# Patient Record
Sex: Female | Born: 1995 | Hispanic: No | Marital: Married | Smoking: Never smoker
Health system: Southern US, Community
[De-identification: ages and names within clinical notes are randomized; demographics above are authoritative.]

---

## 2020-03-01 ENCOUNTER — Emergency Department (HOSPITAL_COMMUNITY)
Admission: EM | Admit: 2020-03-01 | Discharge: 2020-03-01 | Disposition: A | Discharge status: Home / Self Care | Payer: Medicaid Other | Attending: Emergency Medicine | Admitting: Emergency Medicine

## 2020-03-01 ENCOUNTER — Other Ambulatory Visit: Payer: Self-pay

## 2020-03-01 ENCOUNTER — Encounter (HOSPITAL_COMMUNITY): Payer: Self-pay | Admitting: Emergency Medicine

## 2020-03-01 ENCOUNTER — Emergency Department (HOSPITAL_COMMUNITY): Payer: Medicaid Other

## 2020-03-01 DIAGNOSIS — W108XXA Fall (on) (from) other stairs and steps, initial encounter: Secondary | ICD-10-CM | POA: Diagnosis not present

## 2020-03-01 DIAGNOSIS — S82841A Displaced bimalleolar fracture of right lower leg, initial encounter for closed fracture: Secondary | ICD-10-CM | POA: Diagnosis not present

## 2020-03-01 DIAGNOSIS — S99911A Unspecified injury of right ankle, initial encounter: Secondary | ICD-10-CM | POA: Diagnosis present

## 2020-03-01 MED ORDER — OXYCODONE-ACETAMINOPHEN 5-325 MG PO TABS
1.0000 | ORAL_TABLET | Freq: Once | ORAL | Status: AC
  Administered 2020-03-01: 1 via ORAL
  Filled 2020-03-01: qty 1

## 2020-03-01 MED ORDER — IBUPROFEN 600 MG PO TABS: 600.0000 mg | ORAL_TABLET | Freq: Four times a day (QID) | ORAL | 0 refills | Status: DC

## 2020-03-01 MED ORDER — OXYCODONE-ACETAMINOPHEN 5-325 MG PO TABS
1.0000 | ORAL_TABLET | Freq: Four times a day (QID) | ORAL | 0 refills | Status: DC | PRN
Start: 2020-03-01 — End: 2020-03-07

## 2020-03-01 NOTE — ED Triage Notes (Signed)
Patient reports right ankle pain after fall today. Speaks Dari.

## 2020-03-01 NOTE — Progress Notes (Signed)
Orthopedic Tech Progress Note Patient Details:  Jill Sanford January 20, 1996 [id number]  Ortho Devices Type of Ortho Device: Ace wrap, Stirrup splint, Post (short leg) splint, Crutches Ortho Device/Splint Location: RLE splint Ortho Device/Splint Interventions: Ordered, Application, Adjustment   Post Interventions Patient Tolerated: Well Instructions Provided: Care of device   Jennye Moccasin 03/01/2020, 9:17 PM

## 2020-03-01 NOTE — ED Provider Notes (Signed)
[facility]-EMERGENCY DEPT Provider Note   CSN: [id number] Arrival date & time: 03/01/20  1922     History Chief Complaint  Patient presents with  . Ankle Pain    Jill Sanford is a 24 y.o. female presenting to the emergency department with sudden onset of right ankle injury that occurred this morning.  She was going up the stairs today when she inverted her ankle causing injury.  She has had significant pain to the right ankle with worsening swelling since that time.  She denies numbness to her toes, pain in her knee, or head injury.  Not on anticoagulation.  She is breast-feeding her 11 month old.   The history is provided by the patient.       History reviewed. No pertinent past medical history.  There are no problems to display for this patient.   History reviewed. No pertinent surgical history.   OB History   No obstetric history on file.     No family history on file.  Social History   Tobacco Use  . Smoking status: Not on file  Substance Use Topics  . Alcohol use: Not on file  . Drug use: Not on file    Home Medications Prior to Admission medications   Medication Sig Start Date End Date Taking? Authorizing Provider  ibuprofen (ADVIL) 600 MG tablet Take 1 tablet (600 mg total) by mouth every 6 (six) hours. 03/01/20   Brayla Pat, [place] N, PA-C  oxyCODONE-acetaminophen (PERCOCET/ROXICET) 5-325 MG tablet Take 1-2 tablets by mouth every 6 (six) hours as needed for severe pain. 03/01/20   Shuna Tabor, [place] N, PA-C    Allergies    Patient has no known allergies.  Review of Systems   Review of Systems  Musculoskeletal: Positive for arthralgias and joint swelling.  Skin: Negative for wound.  Neurological: Negative for numbness.  All other systems reviewed and are negative.   Physical Exam Updated Vital Signs BP 120/76   Pulse 84   Temp 99.8 F (37.7 C) (Oral)   Resp 16   LMP  (LMP Unknown)   SpO2 100%   Physical Exam Vitals and  nursing note reviewed.  Constitutional:      Appearance: She is well-developed.  HENT:     Head: Normocephalic and atraumatic.  Eyes:     Conjunctiva/sclera: Conjunctivae normal.  Cardiovascular:     Rate and Rhythm: Normal rate.  Pulmonary:     Effort: Pulmonary effort is normal.  Abdominal:     Palpations: Abdomen is soft.  Musculoskeletal:     Comments: Right ankle with significant swelling, bruising medially. Very tender throughout. No wounds. Normal sensation   Skin:    General: Skin is warm.  Neurological:     Mental Status: She is alert.  Psychiatric:        Behavior: Behavior normal.     ED Results / Procedures / Treatments   Labs (all labs ordered are listed, but only abnormal results are displayed) Labs Reviewed - No data to display  EKG None  Radiology DG Ankle Complete Right  Result Date: 03/01/2020 CLINICAL DATA:  Fall for EXAM: RIGHT ANKLE - COMPLETE 3+ VIEW COMPARISON:  None. FINDINGS: There is a comminuted displaced fracture seen through the medial malleolus and distal fibula. There is widening of the medial clear space measuring approximately 8 mm. There is significant surrounding soft tissue swelling. IMPRESSION: Comminuted displaced fractures through the medial malleolus and distal fibula with 8 mm of medial clear space widening.  There is overlying soft tissue swelling. Electronically Signed   By: Jonna Clark M.D.   On: 03/01/2020 20:05    Procedures Procedures (including critical care time)  Medications Ordered in ED Medications  oxyCODONE-acetaminophen (PERCOCET/ROXICET) 5-325 MG per tablet 1 tablet (1 tablet Oral Given 03/01/20 2058)    ED Course  I have reviewed the triage vital signs and the nursing notes.  Pertinent labs & imaging results that were available during my care of the patient were reviewed by me and considered in my medical decision making (see chart for details).    MDM Rules/Calculators/A&P                          Patient  presenting with bimalleolar fracture after tripping up the stairs today.  Neurovascular intact.  No wounds.  Not on anticoagulation.  Consulted with Dr. Ophelia Charter with orthopedics.  Agrees with plan for posterior/stirrup splint, and close outpatient follow-up for surgical management.  Ankle was reduced with gentle pressure to more normal anatomical alignment while splint was placed. NV intact following splint placement by ortho tech. Pain treated, will d/c with rx for pain.  Elevation, ice, ibuprofen.  She is breast-feeding a 38-month-old, discussed risks of narcotic pain medication.  Patient verbalized understanding.  Patient discharged with referral to Dr. Ophelia Charter, instructions to call for appointment on Tuesday per his recommendation.  Return precautions discussed.  Patient is agreeable to plan, appropriate for discharge.   Final Clinical Impression(s) / ED Diagnoses Final diagnoses:  Closed bimalleolar fracture of right ankle, initial encounter    Rx / DC Orders ED Discharge Orders         Ordered    oxyCODONE-acetaminophen (PERCOCET/ROXICET) 5-325 MG tablet  Every 6 hours PRN        03/01/20 2133    ibuprofen (ADVIL) 600 MG tablet  Every 6 hours        03/01/20 2133           Telly Jawad, [place] N, PA-C 03/01/20 2329    Benjiman Core, MD 03/02/20 1452

## 2020-03-01 NOTE — Discharge Instructions (Addendum)
Please read instructions below. Keep the splint clean, dry and in place at all times. Elevate your leg as much as possible. Do not bear any weight on your right leg. You can take oxycodone every 6 hours as needed for severe pain.  Take 600mg  ibuprofen every 6 hours to help manage pain and swelling. Call the orthopedic specialist office the next business day to schedule an appointment for further management and to discuss surgery. He should be able to see you on Tuesday.  Return to the ED for severely worsening pain, numbness in your foot, if your foot turns blue.

## 2020-03-05 ENCOUNTER — Ambulatory Visit (INDEPENDENT_AMBULATORY_CARE_PROVIDER_SITE_OTHER): Payer: Self-pay | Admitting: Orthopaedic Surgery

## 2020-03-05 ENCOUNTER — Other Ambulatory Visit: Payer: Self-pay

## 2020-03-05 ENCOUNTER — Encounter: Payer: Self-pay | Admitting: Orthopaedic Surgery

## 2020-03-05 ENCOUNTER — Other Ambulatory Visit (HOSPITAL_COMMUNITY)
Admission: RE | Admit: 2020-03-05 | Discharge: 2020-03-05 | Disposition: A | Discharge status: Home / Self Care | Payer: HRSA Program | Source: Ambulatory Visit | Attending: Orthopaedic Surgery | Admitting: Orthopaedic Surgery

## 2020-03-05 DIAGNOSIS — Z20822 Contact with and (suspected) exposure to covid-19: Secondary | ICD-10-CM | POA: Insufficient documentation

## 2020-03-05 DIAGNOSIS — Z01812 Encounter for preprocedural laboratory examination: Secondary | ICD-10-CM | POA: Insufficient documentation

## 2020-03-05 DIAGNOSIS — S82841A Displaced bimalleolar fracture of right lower leg, initial encounter for closed fracture: Secondary | ICD-10-CM

## 2020-03-05 LAB — SARS CORONAVIRUS 2 (TAT 6-24 HRS): SARS Coronavirus 2: NEGATIVE

## 2020-03-05 NOTE — Progress Notes (Signed)
Unable to reach pt by phone. Via [employer] (Dari language) Siad, I left pre-op instructions on pt's voicemail.  Covid test done today.

## 2020-03-05 NOTE — Progress Notes (Signed)
Office Visit Note   Patient: Jill Sanford           Date of Birth: 1995/08/01           MRN: [id number] Visit Date: 03/05/2020              Requested by: No referring provider defined for this encounter. PCP: Patient, No Pcp Per   Assessment & Plan: Visit Diagnoses:  1. Bimalleolar ankle fracture, right, closed, initial encounter     Plan: Patient has ankle fracture dislocation with subluxation and unstable bimalleolar ankle fracture with medial and lateral malleolus fractures.  She will require open reduction internal fixation of her right bimalleolar ankle fracture.  We discussed general anesthesia possible preoperative popliteal block to help with postop pain.  If she has successful block she could be treated as an outpatient be discharged home after the procedure.  If a block is not successful she can stay overnight.  Plan procedure discussed with patient, husband through interpreter as well as her local sponsor.  We discussed medial screw fixation of the medial malleolus which is displaced as well as lateral plate screw for  Follow-Up Instructions: No follow-ups on file.   Orders:  No orders of the defined types were placed in this encounter.  No orders of the defined types were placed in this encounter.     Procedures: No procedures performed   Clinical Data: No additional findings.   Subjective: Chief Complaint  Patient presents with  . Right Ankle - Fracture    DOI 03/01/2020    HPI 24 year old female here with her husband as well was husband's nephew interpreter and also local [employer] sponsor is here concerning a right ankle injury that occurred when she is going up steps rolled her ankle suffering a bimalleolar ankle fracture.  Patient is breast-feeding her 62-month-old child is a been unable to walk is been in a sugar tong splint.  Review of Systems no history of ankle injury patient's been active healthy without any surgeries or serious illnesses or visits  to the hospital other than childbirth 2 months ago which was in [place].   Objective: Vital Signs: LMP  (LMP Unknown)   Physical Exam Constitutional:      Appearance: She is well-developed.  HENT:     Head: Normocephalic.     Right Ear: External ear normal.     Left Ear: External ear normal.  Eyes:     Pupils: Pupils are equal, round, and reactive to light.  Neck:     Thyroid: No thyromegaly.     Trachea: No tracheal deviation.  Cardiovascular:     Rate and Rhythm: Normal rate.  Pulmonary:     Effort: Pulmonary effort is normal.  Abdominal:     Palpations: Abdomen is soft.  Skin:    General: Skin is warm and dry.  Neurological:     Mental Status: She is alert and oriented to person, place, and time.  Psychiatric:        Behavior: Behavior normal.     Ortho Exam patient is in a well-padded short leg splint.  Normal color to the toes.  Specialty Comments:  No specialty comments available.  Imaging: Narrative & Impression  CLINICAL DATA:  Fall for  EXAM: RIGHT ANKLE - COMPLETE 3+ VIEW  COMPARISON:  None.  FINDINGS: There is a comminuted displaced fracture seen through the medial malleolus and distal fibula. There is widening of the medial clear space measuring approximately 8 mm. There  is significant surrounding soft tissue swelling.  IMPRESSION: Comminuted displaced fractures through the medial malleolus and distal fibula with 8 mm of medial clear space widening. There is overlying soft tissue swelling.   Electronically Signed   By: Jonna Clark M.D.   On: 03/01/2020 20:05       PMFS History: Patient Active Problem List   Diagnosis Date Noted  . Bimalleolar ankle fracture, right, closed, initial encounter 03/05/2020   No past medical history on file.  No family history on file.  No past surgical history on file. Social History   Occupational History  . Not on file  Tobacco Use  . Smoking status: Never Smoker  . Smokeless  tobacco: Never Used  Substance and Sexual Activity  . Alcohol use: Not Currently  . Drug use: Not on file  . Sexual activity: Not on file

## 2020-03-05 NOTE — H&P (Signed)
Jill Sanford is an 24 y.o. female.   Chief Complaint: Right ankle fracture HPI: Patient with right bimalleolar ankle fracture is being seen by me for preop evaluation.  Patient comes in with husband and interpreter.  Patient was seen in the ED her fifth 2021 after she was going up some stairs and suffered an inversion injury to her ankle.  Patient was put in a splint.  X-rays showed:  IMPRESSION: Comminuted displaced fractures through the medial malleolus and distal fibula with 8 mm of medial clear space widening. There is overlying soft tissue swelling.  No past medical history on file.  No past surgical history on file.  No family history on file. Social History:  reports that she has never smoked. She has never used smokeless tobacco. She reports previous alcohol use. No history on file for drug use.  Allergies: No Known Allergies  No medications prior to admission.    No results found for this or any previous visit (from the past 48 hour(s)). No results found.  Review of Systems  Constitutional: Positive for activity change.  HENT: Negative.   Respiratory: Negative.   Cardiovascular: Negative.   Gastrointestinal: Negative.     There were no vitals taken for this visit. Physical Exam HENT:     Head: Normocephalic and atraumatic.  Eyes:     Extraocular Movements: Extraocular movements intact.     Pupils: Pupils are equal, round, and reactive to light.  Cardiovascular:     Heart sounds: Normal heart sounds.  Pulmonary:     Effort: Pulmonary effort is normal. No respiratory distress.  Skin:    Comments: Splint intact right lower extremity  Neurological:     Mental Status: She is alert and oriented to person, place, and time.  Psychiatric:        Mood and Affect: Mood normal.      Assessment/Plan Right bimalleolar ankle fracture  We will proceed with ORIF right bimalleolar ankle fracture tomorrow.  Surgical procedure discussed earlier with Dr. Ophelia Charter.  Patient  and husband who was present had no further questions for me.  Zonia Kief, PA-C 03/05/2020, 3:33 PM

## 2020-03-06 ENCOUNTER — Ambulatory Visit (HOSPITAL_COMMUNITY): Payer: Self-pay | Admitting: Certified Registered Nurse Anesthetist

## 2020-03-06 ENCOUNTER — Encounter (HOSPITAL_COMMUNITY)
Admission: RE | Disposition: A | Discharge status: Home / Self Care | Payer: Self-pay | Source: Home / Self Care | Attending: Orthopaedic Surgery

## 2020-03-06 ENCOUNTER — Encounter (HOSPITAL_COMMUNITY): Payer: Self-pay | Admitting: Orthopaedic Surgery

## 2020-03-06 ENCOUNTER — Observation Stay (HOSPITAL_COMMUNITY)
Admission: RE | Admit: 2020-03-06 | Discharge: 2020-03-07 | Disposition: A | Discharge status: Home / Self Care | Payer: Self-pay | Attending: Orthopaedic Surgery | Admitting: Orthopaedic Surgery

## 2020-03-06 ENCOUNTER — Other Ambulatory Visit: Payer: Self-pay

## 2020-03-06 ENCOUNTER — Ambulatory Visit (HOSPITAL_COMMUNITY): Payer: Self-pay

## 2020-03-06 DIAGNOSIS — X500XXA Overexertion from strenuous movement or load, initial encounter: Secondary | ICD-10-CM | POA: Insufficient documentation

## 2020-03-06 DIAGNOSIS — S93431A Sprain of tibiofibular ligament of right ankle, initial encounter: Secondary | ICD-10-CM | POA: Insufficient documentation

## 2020-03-06 DIAGNOSIS — Z419 Encounter for procedure for purposes other than remedying health state, unspecified: Secondary | ICD-10-CM

## 2020-03-06 DIAGNOSIS — S82851A Displaced trimalleolar fracture of right lower leg, initial encounter for closed fracture: Principal | ICD-10-CM | POA: Insufficient documentation

## 2020-03-06 DIAGNOSIS — S82891A Other fracture of right lower leg, initial encounter for closed fracture: Secondary | ICD-10-CM | POA: Diagnosis present

## 2020-03-06 DIAGNOSIS — S93439A Sprain of tibiofibular ligament of unspecified ankle, initial encounter: Secondary | ICD-10-CM

## 2020-03-06 HISTORY — PX: ORIF ANKLE FRACTURE: SHX5408

## 2020-03-06 LAB — CBC
HCT: 36.6 % (ref 36.0–46.0)
Hemoglobin: 11.9 g/dL — ABNORMAL LOW (ref 12.0–15.0)
MCH: 29.3 pg (ref 26.0–34.0)
MCHC: 32.5 g/dL (ref 30.0–36.0)
MCV: 90.1 fL (ref 80.0–100.0)
Platelets: 271 10*3/uL (ref 150–400)
RBC: 4.06 MIL/uL (ref 3.87–5.11)
RDW: 11 % — ABNORMAL LOW (ref 11.5–15.5)
WBC: 7.2 10*3/uL (ref 4.0–10.5)
nRBC: 0 % (ref 0.0–0.2)

## 2020-03-06 LAB — BASIC METABOLIC PANEL
Anion gap: 12 (ref 5–15)
BUN: 13 mg/dL (ref 6–20)
CO2: 22 mmol/L (ref 22–32)
Calcium: 9.3 mg/dL (ref 8.9–10.3)
Chloride: 105 mmol/L (ref 98–111)
Creatinine, Ser: 0.52 mg/dL (ref 0.44–1.00)
GFR, Estimated: >60 mL/min (ref >60)
Glucose, Bld: 88 mg/dL (ref 70–99)
Potassium: 4.6 mmol/L (ref 3.5–5.1)
Sodium: 139 mmol/L (ref 135–145)

## 2020-03-06 LAB — SURGICAL PCR SCREEN
MRSA, PCR: NEGATIVE
Staphylococcus aureus: NEGATIVE

## 2020-03-06 LAB — POCT PREGNANCY, URINE: Preg Test, Ur: NEGATIVE

## 2020-03-06 SURGERY — OPEN REDUCTION INTERNAL FIXATION (ORIF) ANKLE FRACTURE
Anesthesia: General | Site: Ankle | Laterality: Right

## 2020-03-06 MED ORDER — EPHEDRINE SULFATE-NACL 50-0.9 MG/10ML-% IV SOSY
PREFILLED_SYRINGE | INTRAVENOUS | Status: DC | PRN
  Administered 2020-03-06 (×4): 5 mg via INTRAVENOUS

## 2020-03-06 MED ORDER — DEXAMETHASONE SODIUM PHOSPHATE 10 MG/ML IJ SOLN
INTRAMUSCULAR | Status: DC | PRN
  Administered 2020-03-06: 10 mg via INTRAVENOUS

## 2020-03-06 MED ORDER — SODIUM CHLORIDE 0.9 % IV SOLN: INTRAVENOUS | Status: DC

## 2020-03-06 MED ORDER — ACETAMINOPHEN 500 MG PO TABS
ORAL_TABLET | ORAL | Status: AC
  Administered 2020-03-06: 1000 mg via ORAL
  Filled 2020-03-06: qty 2

## 2020-03-06 MED ORDER — CEFAZOLIN SODIUM-DEXTROSE 2-4 GM/100ML-% IV SOLN
INTRAVENOUS | Status: AC
  Filled 2020-03-06: qty 100

## 2020-03-06 MED ORDER — OXYCODONE HCL 5 MG/5ML PO SOLN: 5.0000 mg | Freq: Once | ORAL | Status: DC | PRN

## 2020-03-06 MED ORDER — CEFAZOLIN SODIUM-DEXTROSE 2-4 GM/100ML-% IV SOLN
2.0000 g | INTRAVENOUS | Status: AC
  Administered 2020-03-06: 2 g via INTRAVENOUS

## 2020-03-06 MED ORDER — DOCUSATE SODIUM 100 MG PO CAPS
100.0000 mg | ORAL_CAPSULE | Freq: Two times a day (BID) | ORAL | Status: DC
  Administered 2020-03-06 – 2020-03-07 (×2): 100 mg via ORAL
  Filled 2020-03-06 (×2): qty 1

## 2020-03-06 MED ORDER — ACETAMINOPHEN 500 MG PO TABS: 1000.0000 mg | ORAL_TABLET | Freq: Once | ORAL | Status: AC

## 2020-03-06 MED ORDER — EPHEDRINE 5 MG/ML INJ
INTRAVENOUS | Status: AC
  Filled 2020-03-06: qty 10

## 2020-03-06 MED ORDER — PHENYLEPHRINE HCL-NACL 10-0.9 MG/250ML-% IV SOLN
INTRAVENOUS | Status: DC | PRN
  Administered 2020-03-06: 20 ug/min via INTRAVENOUS

## 2020-03-06 MED ORDER — MIDAZOLAM HCL 2 MG/2ML IJ SOLN: 0.5000 mg | Freq: Once | INTRAMUSCULAR | Status: DC | PRN

## 2020-03-06 MED ORDER — ORAL CARE MOUTH RINSE: 15.0000 mL | Freq: Once | OROMUCOSAL | Status: AC

## 2020-03-06 MED ORDER — MIDAZOLAM HCL 2 MG/2ML IJ SOLN: 1.0000 mg | Freq: Once | INTRAMUSCULAR | Status: AC

## 2020-03-06 MED ORDER — ONDANSETRON HCL 4 MG/2ML IJ SOLN
INTRAMUSCULAR | Status: DC | PRN
  Administered 2020-03-06: 4 mg via INTRAVENOUS

## 2020-03-06 MED ORDER — PHENYLEPHRINE 40 MCG/ML (10ML) SYRINGE FOR IV PUSH (FOR BLOOD PRESSURE SUPPORT)
PREFILLED_SYRINGE | INTRAVENOUS | Status: DC | PRN
  Administered 2020-03-06: 80 ug via INTRAVENOUS
  Administered 2020-03-06: 40 ug via INTRAVENOUS
  Administered 2020-03-06: 80 ug via INTRAVENOUS
  Administered 2020-03-06: 120 ug via INTRAVENOUS
  Administered 2020-03-06 (×3): 80 ug via INTRAVENOUS

## 2020-03-06 MED ORDER — ONDANSETRON HCL 4 MG/2ML IJ SOLN: 4.0000 mg | Freq: Four times a day (QID) | INTRAMUSCULAR | Status: DC | PRN

## 2020-03-06 MED ORDER — MEPERIDINE HCL 25 MG/ML IJ SOLN: 6.2500 mg | INTRAMUSCULAR | Status: DC | PRN

## 2020-03-06 MED ORDER — PROMETHAZINE HCL 25 MG/ML IJ SOLN: 6.2500 mg | INTRAMUSCULAR | Status: DC | PRN

## 2020-03-06 MED ORDER — MIDAZOLAM HCL 2 MG/2ML IJ SOLN
INTRAMUSCULAR | Status: AC
  Administered 2020-03-06: 1 mg via INTRAVENOUS
  Filled 2020-03-06: qty 2

## 2020-03-06 MED ORDER — OXYCODONE HCL 5 MG PO TABS: 5.0000 mg | ORAL_TABLET | Freq: Once | ORAL | Status: DC | PRN

## 2020-03-06 MED ORDER — METOCLOPRAMIDE HCL 5 MG/ML IJ SOLN: 5.0000 mg | Freq: Three times a day (TID) | INTRAMUSCULAR | Status: DC | PRN

## 2020-03-06 MED ORDER — SCOPOLAMINE 1 MG/3DAYS TD PT72
MEDICATED_PATCH | TRANSDERMAL | Status: AC
  Administered 2020-03-06: 1.5 mg via TRANSDERMAL
  Filled 2020-03-06: qty 1

## 2020-03-06 MED ORDER — ONDANSETRON HCL 4 MG PO TABS: 4.0000 mg | ORAL_TABLET | Freq: Four times a day (QID) | ORAL | Status: DC | PRN

## 2020-03-06 MED ORDER — FENTANYL CITRATE (PF) 250 MCG/5ML IJ SOLN
INTRAMUSCULAR | Status: DC | PRN
  Administered 2020-03-06 (×2): 25 ug via INTRAVENOUS

## 2020-03-06 MED ORDER — BUPIVACAINE-EPINEPHRINE (PF) 0.5% -1:200000 IJ SOLN
INTRAMUSCULAR | Status: DC | PRN
  Administered 2020-03-06: 15 mL via PERINEURAL
  Administered 2020-03-06: 20 mL via PERINEURAL

## 2020-03-06 MED ORDER — LACTATED RINGERS IV SOLN: INTRAVENOUS | Status: DC

## 2020-03-06 MED ORDER — FENTANYL CITRATE (PF) 250 MCG/5ML IJ SOLN
INTRAMUSCULAR | Status: AC
  Filled 2020-03-06: qty 5

## 2020-03-06 MED ORDER — 0.9 % SODIUM CHLORIDE (POUR BTL) OPTIME
TOPICAL | Status: DC | PRN
  Administered 2020-03-06: 1000 mL

## 2020-03-06 MED ORDER — FENTANYL CITRATE (PF) 100 MCG/2ML IJ SOLN
INTRAMUSCULAR | Status: AC
  Administered 2020-03-06: 50 ug via INTRAVENOUS
  Filled 2020-03-06: qty 2

## 2020-03-06 MED ORDER — FENTANYL CITRATE (PF) 100 MCG/2ML IJ SOLN: 50.0000 ug | Freq: Once | INTRAMUSCULAR | Status: AC

## 2020-03-06 MED ORDER — OXYCODONE HCL 5 MG PO TABS
5.0000 mg | ORAL_TABLET | ORAL | Status: DC | PRN
  Administered 2020-03-07: 5 mg via ORAL
  Filled 2020-03-06 (×2): qty 1

## 2020-03-06 MED ORDER — METOCLOPRAMIDE HCL 5 MG PO TABS: 5.0000 mg | ORAL_TABLET | Freq: Three times a day (TID) | ORAL | Status: DC | PRN

## 2020-03-06 MED ORDER — PHENYLEPHRINE 40 MCG/ML (10ML) SYRINGE FOR IV PUSH (FOR BLOOD PRESSURE SUPPORT)
PREFILLED_SYRINGE | INTRAVENOUS | Status: AC
  Filled 2020-03-06: qty 10

## 2020-03-06 MED ORDER — BUPIVACAINE HCL (PF) 0.25 % IJ SOLN
INTRAMUSCULAR | Status: AC
  Filled 2020-03-06: qty 30

## 2020-03-06 MED ORDER — SCOPOLAMINE 1 MG/3DAYS TD PT72: 1.0000 | MEDICATED_PATCH | TRANSDERMAL | Status: DC

## 2020-03-06 MED ORDER — CHLORHEXIDINE GLUCONATE 0.12 % MT SOLN
OROMUCOSAL | Status: AC
  Administered 2020-03-06: 15 mL via OROMUCOSAL
  Filled 2020-03-06: qty 15

## 2020-03-06 MED ORDER — CHLORHEXIDINE GLUCONATE 0.12 % MT SOLN: 15.0000 mL | Freq: Once | OROMUCOSAL | Status: AC

## 2020-03-06 MED ORDER — BUPIVACAINE HCL (PF) 0.25 % IJ SOLN: INTRAMUSCULAR | Status: DC | PRN

## 2020-03-06 MED ORDER — HYDROMORPHONE HCL 1 MG/ML IJ SOLN
INTRAMUSCULAR | Status: AC
  Filled 2020-03-06: qty 1

## 2020-03-06 MED ORDER — LIDOCAINE 2% (20 MG/ML) 5 ML SYRINGE
INTRAMUSCULAR | Status: DC | PRN
  Administered 2020-03-06: 20 mg via INTRAVENOUS

## 2020-03-06 MED ORDER — HYDROMORPHONE HCL 1 MG/ML IJ SOLN
0.2500 mg | INTRAMUSCULAR | Status: DC | PRN
  Administered 2020-03-06: 0.5 mg via INTRAVENOUS

## 2020-03-06 MED ORDER — ASPIRIN 325 MG PO TABS
325.0000 mg | ORAL_TABLET | Freq: Every day | ORAL | Status: DC
  Administered 2020-03-07: 325 mg via ORAL
  Filled 2020-03-06: qty 1

## 2020-03-06 MED ORDER — PROPOFOL 10 MG/ML IV BOLUS
INTRAVENOUS | Status: AC
  Filled 2020-03-06: qty 20

## 2020-03-06 MED ORDER — PROPOFOL 10 MG/ML IV BOLUS
INTRAVENOUS | Status: DC | PRN
  Administered 2020-03-06: 50 mg via INTRAVENOUS
  Administered 2020-03-06: 200 mg via INTRAVENOUS
  Administered 2020-03-06 (×2): 50 mg via INTRAVENOUS

## 2020-03-06 MED ORDER — MIDAZOLAM HCL 2 MG/2ML IJ SOLN
INTRAMUSCULAR | Status: AC
  Filled 2020-03-06: qty 2

## 2020-03-06 MED ORDER — ACETAMINOPHEN 500 MG PO TABS
500.0000 mg | ORAL_TABLET | Freq: Once | ORAL | Status: DC
Start: 2020-03-06 — End: 2020-03-06

## 2020-03-06 SURGICAL SUPPLY — 64 items
BANDAGE ESMARK 6X9 LF (GAUZE/BANDAGES/DRESSINGS) IMPLANT
BIT DRILL 2.5X2.75 QC CALB (BIT) ×3 IMPLANT
BIT DRILL 2.9 CANN QC NONSTRL (BIT) ×3 IMPLANT
BNDG ELASTIC 4X5.8 VLCR STR LF (GAUZE/BANDAGES/DRESSINGS) IMPLANT
BNDG ELASTIC 6X5.8 VLCR STR LF (GAUZE/BANDAGES/DRESSINGS) ×3 IMPLANT
BNDG ESMARK 6X9 LF (GAUZE/BANDAGES/DRESSINGS)
COVER MAYO STAND STRL (DRAPES) ×3 IMPLANT
COVER SURGICAL LIGHT HANDLE (MISCELLANEOUS) ×3 IMPLANT
COVER WAND RF STERILE (DRAPES) IMPLANT
CUFF TOURN SGL QUICK 34 (TOURNIQUET CUFF) ×2
CUFF TOURN SGL QUICK 42 (TOURNIQUET CUFF) IMPLANT
CUFF TRNQT CYL 34X4.125X (TOURNIQUET CUFF) ×1 IMPLANT
DRAPE C-ARM 42X72 X-RAY (DRAPES) ×3 IMPLANT
DRAPE HALF SHEET 40X57 (DRAPES) ×3 IMPLANT
DRAPE INCISE IOBAN 66X45 STRL (DRAPES) ×3 IMPLANT
DRAPE U-SHAPE 47X51 STRL (DRAPES) ×3 IMPLANT
DRSG PAD ABDOMINAL 8X10 ST (GAUZE/BANDAGES/DRESSINGS) ×6 IMPLANT
DURAPREP 26ML APPLICATOR (WOUND CARE) ×3 IMPLANT
ELECT PENCIL ROCKER SW 15FT (MISCELLANEOUS) ×3 IMPLANT
ELECT REM PT RETURN 9FT ADLT (ELECTROSURGICAL) ×3
ELECTRODE REM PT RTRN 9FT ADLT (ELECTROSURGICAL) ×1 IMPLANT
GAUZE SPONGE 4X4 12PLY STRL (GAUZE/BANDAGES/DRESSINGS) IMPLANT
GAUZE XEROFORM 5X9 LF (GAUZE/BANDAGES/DRESSINGS) ×3 IMPLANT
GLOVE BIOGEL PI IND STRL 8 (GLOVE) ×1 IMPLANT
GLOVE BIOGEL PI INDICATOR 8 (GLOVE) ×2
GLOVE ORTHO TXT STRL SZ7.5 (GLOVE) ×3 IMPLANT
GOWN STRL REUS W/ TWL LRG LVL3 (GOWN DISPOSABLE) ×1 IMPLANT
GOWN STRL REUS W/ TWL XL LVL3 (GOWN DISPOSABLE) ×1 IMPLANT
GOWN STRL REUS W/TWL 2XL LVL3 (GOWN DISPOSABLE) IMPLANT
GOWN STRL REUS W/TWL LRG LVL3 (GOWN DISPOSABLE) ×2
GOWN STRL REUS W/TWL XL LVL3 (GOWN DISPOSABLE) ×2
K-WIRE ACE 1.6X6 (WIRE) ×6
KIT BASIN OR (CUSTOM PROCEDURE TRAY) ×3 IMPLANT
KIT TURNOVER KIT B (KITS) ×3 IMPLANT
KWIRE ACE 1.6X6 (WIRE) ×2 IMPLANT
MANIFOLD NEPTUNE II (INSTRUMENTS) ×3 IMPLANT
NS IRRIG 1000ML POUR BTL (IV SOLUTION) ×3 IMPLANT
PACK ORTHO EXTREMITY (CUSTOM PROCEDURE TRAY) ×3 IMPLANT
PAD ARMBOARD 7.5X6 YLW CONV (MISCELLANEOUS) ×6 IMPLANT
PAD CAST 4YDX4 CTTN HI CHSV (CAST SUPPLIES) IMPLANT
PADDING CAST COTTON 4X4 STRL (CAST SUPPLIES)
PADDING CAST COTTON 6X4 STRL (CAST SUPPLIES) ×6 IMPLANT
PLATE LOCK 7H 92 BILAT FIB (Plate) ×3 IMPLANT
SCREW ACE CAN 4.0 40M (Screw) ×6 IMPLANT
SCREW LOCK CORT STAR 3.5X10 (Screw) ×6 IMPLANT
SCREW LOCK CORT STAR 3.5X12 (Screw) ×3 IMPLANT
SCREW LOW PROFILE 12MMX3.5MM (Screw) ×9 IMPLANT
SCREW LOW PROFILE 22MMX3.5MM (Screw) ×3 IMPLANT
SCREW LP 3.5 (Screw) ×3 IMPLANT
SPLINT FIBERGLASS 3X35 (CAST SUPPLIES) ×6 IMPLANT
SPONGE LAP 18X18 RF (DISPOSABLE) ×3 IMPLANT
STAPLER VISISTAT 35W (STAPLE) ×3 IMPLANT
SUCTION FRAZIER HANDLE 10FR (MISCELLANEOUS) ×2
SUCTION TUBE FRAZIER 10FR DISP (MISCELLANEOUS) ×1 IMPLANT
SUT ETHILON 3 0 PS 1 (SUTURE) ×6 IMPLANT
SUT VIC AB 2-0 CT1 27 (SUTURE) ×4
SUT VIC AB 2-0 CT1 TAPERPNT 27 (SUTURE) ×2 IMPLANT
SYR CONTROL 10ML LL (SYRINGE) ×3 IMPLANT
TOWEL GREEN STERILE (TOWEL DISPOSABLE) ×3 IMPLANT
TOWEL GREEN STERILE FF (TOWEL DISPOSABLE) ×3 IMPLANT
TUBE CONNECTING 12'X1/4 (SUCTIONS) ×1
TUBE CONNECTING 12X1/4 (SUCTIONS) ×2 IMPLANT
WATER STERILE IRR 1000ML POUR (IV SOLUTION) ×3 IMPLANT
YANKAUER SUCT BULB TIP NO VENT (SUCTIONS) ×3 IMPLANT

## 2020-03-06 NOTE — Anesthesia Preprocedure Evaluation (Addendum)
Anesthesia Evaluation  Patient identified by MRN, date of birth, ID band Patient awake    Reviewed: Allergy & Precautions, NPO status , Patient's Chart, lab work & pertinent test results  History of Anesthesia Complications Negative for: history of anesthetic complications  Airway Mallampati: II  TM Distance: >3 FB Neck ROM: Full    Dental  (+) Dental Advisory Given   Pulmonary neg pulmonary ROS,  03/05/2020 SARS coronavirus NEG   breath sounds clear to auscultation       Cardiovascular negative cardio ROS   Rhythm:Regular Rate:Normal     Neuro/Psych negative neurological ROS     GI/Hepatic negative GI ROS, Neg liver ROS,   Endo/Other  negative endocrine ROS  Renal/GU negative Renal ROS     Musculoskeletal   Abdominal   Peds  Hematology negative hematology ROS (+)   Anesthesia Other Findings   Reproductive/Obstetrics (+) Breast feeding                             Anesthesia Physical Anesthesia Plan  ASA: I  Anesthesia Plan: General   Post-op Pain Management: GA combined w/ Regional for post-op pain   Induction: Intravenous  PONV Risk Score and Plan: 3 and Ondansetron, Dexamethasone and Scopolamine patch - Pre-op  Airway Management Planned: LMA  Additional Equipment: None  Intra-op Plan:   Post-operative Plan:   Informed Consent: I have reviewed the patients History and Physical, chart, labs and discussed the procedure including the risks, benefits and alternatives for the proposed anesthesia with the patient or authorized representative who has indicated his/her understanding and acceptance.     Dental advisory given  Plan Discussed with: CRNA and Surgeon  Anesthesia Plan Comments: (Plan routine monitors, GA with adductor canal and popliteal blocks for post op analgesia)       Anesthesia Quick Evaluation

## 2020-03-06 NOTE — Progress Notes (Addendum)
Patient gave birth four days ago.  Spoke with OB rapid response RN Theone Murdoch.  Gave MRN and room assignment [phone] for appropriate follow up.   Upon further investigation by [facility] RN Toni Amend, baby was born two months ago.  No OB follow up needed at this point.

## 2020-03-06 NOTE — Transfer of Care (Signed)
Immediate Anesthesia Transfer of Care Note  Patient: Marketing executive  Procedure(s) Performed: OPEN REDUCTION INTERNAL FIXATION RIGHT BIMALLEOLAR ANKLE FRACTURE (Right Ankle)  Patient Location: PACU  Anesthesia Type:General  Level of Consciousness: drowsy  Airway & Oxygen Therapy: Patient Spontanous Breathing and Patient connected to face mask  Post-op Assessment: Report given to RN and Post -op Vital signs reviewed and stable  Post vital signs: Reviewed and stable  Last Vitals:  Vitals Value Taken Time  BP 116/47 03/06/20 1856  Temp    Pulse 108 03/06/20 1858  Resp 26 03/06/20 1858  SpO2 99 % 03/06/20 1858  Vitals shown include unvalidated device data.  Last Pain:  Vitals:   03/06/20 1359  TempSrc:   PainSc: 0-No pain         Complications: No complications documented.

## 2020-03-06 NOTE — Anesthesia Procedure Notes (Signed)
Procedure Name: LMA Insertion Date/Time: 03/06/2020 5:14 PM Performed by: Lelon Perla, CRNA Pre-anesthesia Checklist: Patient identified, Emergency Drugs available, Suction available and Patient being monitored Patient Re-evaluated:Patient Re-evaluated prior to induction Oxygen Delivery Method: Circle System Utilized Preoxygenation: Pre-oxygenation with 100% oxygen Induction Type: IV induction Ventilation: Mask ventilation without difficulty LMA: LMA inserted LMA Size: 4.0 Number of attempts: 1 Airway Equipment and Method: Bite block Placement Confirmation: positive ETCO2 Tube secured with: Tape Dental Injury: Teeth and Oropharynx as per pre-operative assessment

## 2020-03-06 NOTE — Interval H&P Note (Signed)
History and Physical Interval Note:  03/06/2020 4:26 PM  Abby [place]  has presented today for surgery, with the diagnosis of right bimalleolar ankle fracture.  The various methods of treatment have been discussed with the patient and family. After consideration of risks, benefits and other options for treatment, the patient has consented to  Procedure(s): OPEN REDUCTION INTERNAL FIXATION RIGHT BIMALLEOLAR ANKLE FRACTURE (Right) as a surgical intervention.  The patient's history has been reviewed, patient examined, no change in status, stable for surgery.  I have reviewed the patient's chart and labs.  Questions were answered to the patient's satisfaction.     Eldred Manges

## 2020-03-06 NOTE — Progress Notes (Signed)
Patient arrival to 5N3 via hospital bed. Interpreter and family at bedside.Patient in bed resting ,bed in lowest position,call bell within reach.

## 2020-03-06 NOTE — Op Note (Signed)
Preop diagnosis: Right bimalleolar closed ankle fracture  Postop diagnosis:  Right closed trimalleolar ankle fracture, syndosmotic rupture    Procedure: Open reduction internal fixation bimalleolar fixation medial lateral malleolus of right closed trimalleolar ankle fracture.  Open reduction internal fixation of syndesmosis.  Surgeon Annell Greening, MD  Tourniquet 47 minutes  Anesthesia preoperative block popliteal plus LMA general anesthesia.  Complications: None  Implants Biomet composite lateral titanium plate.  Cannulated medial screws x2 medial malleolus 40 mm partially-threaded.  Procedure: After standard prepping draping was noted patient had blisters medially.  Initially x-ray showed bimalleolar ankle fracture.  After timeout procedure leg was elevated tourniquet inflated.  C-shaped curved medial incision was made saphenous vein was mobilized fracture hematoma was evacuated fracture was able to be reduced.  Sterile sponge was placed in lateral incision was made over the fibula subperiosteal dissection of the fibula evacuation hematoma reduction held with subtenon clamp and a lateral plate was selected.  Proximal to bicortical nonlocking screws were placed sucking the plate down.  Distal most screw using the guide was then angled at the tip of the fibula still remain unicortical.  10 mm locking screw was placed 12 and a second all for the bottom.  Third hole and this was filled with another locking screw however after fixation of all screw holes and then interfrag placed from anterior to posterior 22 mm in length visualization AP and lateral showed that there was a posterior malleolar fragment.  Attention was turned to medial side medial malleolus fragment was reduced after evacuation of the hematoma and held with a K wire and then to 40 mm partially-threaded lag screws were placed with anatomic reduction.  The posterior malleolar fragment was less than 1/5 of the joint did not require  fixation with carefully look good posteriorly on the fibula and there was no combination of the fibula down at that level.  Preoperative films did not show the posterior malleolar fragment but it was small left did not require operative fixation but the syndesmosis was widened and locking screw which is in the third hole from the bottom was removed was just above the level of the joint and a 50 mm for cortical screw was used squeezing the syndesmosis only in the ankle at 90 degrees and squeeze normally aligned with locking the screw down tightly.  Final images were taken showing good position alignment irrigation with saline solution.  2-0 Vicryl subtenons tissue after tourniquet deflation.  Skin staple closure Xeroform over the medial and lateral abrasions with abrasions lateral and medial fracture blisters.  No local was used since patient had preoperative block and short leg fiberglass splint was applied with extra padding.  Patient tolerated procedure well stay overnight discharged in a.m. nonweightbearing and office follow-up 1 week.

## 2020-03-06 NOTE — Anesthesia Procedure Notes (Signed)
Anesthesia Regional Block: Popliteal block   Pre-Anesthetic Checklist: ,, timeout performed, Correct Patient, Correct Site, Correct Laterality, Correct Procedure, Correct Position, site marked, Risks and benefits discussed,  Surgical consent,  Pre-op evaluation,  At surgeon's request and post-op pain management  Laterality: Right and Lower  Prep: chloraprep       Needles:  Injection technique: Single-shot  Needle Type: Echogenic Needle     Needle Length: 9cm  Needle Gauge: 21     Additional Needles:   Procedures:,,,, ultrasound used (permanent image in chart),,,,  Narrative:  Start time: 03/06/2020 4:44 PM End time: 03/06/2020 4:47 PM Injection made incrementally with aspirations every 5 mL.  Performed by: Personally  Anesthesiologist: Jairo Ben, MD  Additional Notes: Pt identified in Holding room.  Monitors applied. Working IV access confirmed. Sterile prep R lateral knee.  #21ga ECHOgenic Arrow block needle to sciatic nerve at split with US guidance.  20cc 0.5% Bupivacaine with 1:200k epi injected incrementally after negative test dose.  Patient asymptomatic, VSS, no heme aspirated, tolerated well.  Sandford Craze, MD

## 2020-03-06 NOTE — Progress Notes (Signed)
Patient speaks Dari.  [facility] Gainer interpreter Mahin [phone] at bedside.  VSS stable, patient with no complaints.  Will continue to monitor.

## 2020-03-06 NOTE — Anesthesia Procedure Notes (Signed)
Anesthesia Regional Block: Adductor canal block   Pre-Anesthetic Checklist: ,, timeout performed, Correct Patient, Correct Site, Correct Laterality, Correct Procedure, Correct Position, site marked, Risks and benefits discussed,  Surgical consent,  Pre-op evaluation,  At surgeon's request and post-op pain management  Laterality: Right and Lower  Prep: chloraprep       Needles:  Injection technique: Single-shot  Needle Type: Echogenic Needle     Needle Length: 9cm  Needle Gauge: 21     Additional Needles:   Procedures:,,,, ultrasound used (permanent image in chart),,,,  Narrative:  Start time: 03/06/2020 4:36 PM End time: 03/06/2020 4:43 PM Injection made incrementally with aspirations every 5 mL.  Performed by: Personally  Anesthesiologist: Jairo Ben, MD  Additional Notes: Pt identified in Holding room.  Monitors applied. Working IV access confirmed. Sterile prep R thigh.  #21ga ECHOgenic Arrow block needle into adductor canal with US guidance.  15cc 0.5% Bupivacaine with 1:200k epi injected incrementally after negative test dose.  Patient asymptomatic, VSS, no heme aspirated, tolerated well.  Sandford Craze, MD

## 2020-03-07 ENCOUNTER — Encounter (HOSPITAL_COMMUNITY): Payer: Self-pay | Admitting: Orthopaedic Surgery

## 2020-03-07 MED ORDER — ASPIRIN 325 MG PO TABS: 325.0000 mg | ORAL_TABLET | Freq: Every day | ORAL | 0 refills | Status: AC

## 2020-03-07 MED ORDER — OXYCODONE-ACETAMINOPHEN 5-325 MG PO TABS
1.0000 | ORAL_TABLET | ORAL | 0 refills | Status: AC | PRN
Start: 2020-03-07 — End: 2021-03-07

## 2020-03-07 NOTE — Plan of Care (Signed)
  Problem: Education: Goal: Knowledge of General Education information will improve Description Including pain rating scale, medication(s)/side effects and non-pharmacologic comfort measures Outcome: Progressing   

## 2020-03-07 NOTE — Evaluation (Signed)
Occupational Therapy Evaluation/Discharge Patient Details Name: Jill Sanford MRN: [id number] DOB: Sep 15, 1995 Today's Date: 03/07/2020    History of Present Illness Jill Sanford is an 24 y.o. female who suffered inversion injury to ankle as she was going up stairs. Pt found to have R bimalleolar ankle fx and underwent R ORIF.    Clinical Impression   PTA, pt Independent in all daily tasks. Pt plans to discharge to brother in law's home with level entry and will have strong family support. Pt with minor deficits in pain and balance secondary to WB precautions. Pt able to demonstrate mobility to/from bathroom with RW at min guard. Pt requires Setup for UB ADLs and min guard for LB ADLs. Educated pt on compensatory strategies for ADLs to increase safety with pt verbalizing understanding. Pt reports feeling more stable with RW than crutches. No further OT services needed at this time.     Follow Up Recommendations  No OT follow up;Supervision - Intermittent    Equipment Recommendations  3 in 1 bedside commode;Tub/shower bench;Wheelchair (measurements OT);Wheelchair cushion (measurements OT);Other (comment) (pediatric RW)    Recommendations for Other Services       Precautions / Restrictions Precautions Precautions: Fall Restrictions Weight Bearing Restrictions: Yes RLE Weight Bearing: Non weight bearing      Mobility Bed Mobility Overal bed mobility: Needs Assistance Bed Mobility: Supine to Sit;Sit to Supine     Supine to sit: Min assist;HOB elevated Sit to supine: Min assist;HOB elevated   General bed mobility comments: Pt's sister in law providing light assist for trunk advancement and assisting R LE back into bed on pillows    Transfers Overall transfer level: Needs assistance Equipment used: Rolling walker (2 wheeled) Transfers: Sit to/from [employer] Sit to Stand: Supervision Stand pivot transfers: Min guard       General transfer comment:  supervision for sit to stand, min guard for turning with RW. Pt able to demo proper hand placement after education    Balance Overall balance assessment: Needs assistance Sitting-balance support: No upper extremity supported;Feet supported Sitting balance-Leahy Scale: Good     Standing balance support: Bilateral upper extremity supported;During functional activity;Single extremity supported Standing balance-Leahy Scale: Poor Standing balance comment: reliant on UE support due to WB precautions                           ADL either performed or assessed with clinical judgement   ADL Overall ADL's : Needs assistance/impaired Eating/Feeding: Independent;Sitting   Grooming: Min guard;Standing;Wash/dry hands Grooming Details (indicate cue type and reason): min guard for balance during bimanual task Upper Body Bathing: Set up;Sitting   Lower Body Bathing: Min guard;Sit to/from stand   Upper Body Dressing : Set up;Sitting   Lower Body Dressing: Min guard;Sit to/from stand;Sitting/lateral leans   Toilet Transfer: Min guard;Ambulation;Regular Toilet;RW Toilet Transfer Details (indicate cue type and reason): min guard, cueing for pacing as pt rushing due to urinary urgency  Toileting- Clothing Manipulation and Hygiene: Min guard;Sit to/from stand;Sitting/lateral lean Toileting - Clothing Manipulation Details (indicate cue type and reason): to ensure balance and safety     Functional mobility during ADLs: Min guard;Rolling walker General ADL Comments: Minor limitations in balance due to WB precautions     Vision Baseline Vision/History: No visual deficits Patient Visual Report: No change from baseline Vision Assessment?: No apparent visual deficits     Perception     Praxis      Pertinent Vitals/Pain Pain Assessment:  Faces Faces Pain Scale: Hurts a little bit Pain Location: R LE Pain Descriptors / Indicators: Grimacing Pain Intervention(s): Limited activity within  patient's tolerance;Monitored during session;Ice applied     Hand Dominance Right   Extremity/Trunk Assessment Upper Extremity Assessment Upper Extremity Assessment: Overall WFL for tasks assessed   Lower Extremity Assessment Lower Extremity Assessment: Defer to PT evaluation   Cervical / Trunk Assessment Cervical / Trunk Assessment: Normal   Communication Communication Communication: Interpreter utilized   Cognition Arousal/Alertness: Awake/alert Behavior During Therapy: Flat affect Overall Cognitive Status: Within Functional Limits for tasks assessed                                     General Comments  Educated on elevation, ice to prevent swelling, techniques for tub transfer    Exercises     Shoulder Instructions      Home Living Family/patient expects to be discharged to:: Private residence Living Arrangements: Children;Other relatives Available Help at Discharge: Family;Available 24 hours/day Type of Home: House Home Access: Level entry     Home Layout: One level     Bathroom Shower/Tub: Chief Strategy Officer: Standard     Home Equipment: Crutches   Additional Comments: Pt plans to discharge to brother-in-law's house with level entry. Plenty of family to assist with pt and her young child       Prior Functioning/Environment Level of Independence: Independent                 OT Problem List: Impaired balance (sitting and/or standing);Pain      OT Treatment/Interventions:      OT Goals(Current goals can be found in the care plan section) Acute Rehab OT Goals Patient Stated Goal: get home to family  OT Frequency:     Barriers to D/C:            Co-evaluation              AM-PAC OT "6 Clicks" Daily Activity     Outcome Measure Help from another person eating meals?: None Help from another person taking care of personal grooming?: A Little Help from another person toileting, which includes using  toliet, bedpan, or urinal?: A Little Help from another person bathing (including washing, rinsing, drying)?: A Little Help from another person to put on and taking off regular upper body clothing?: A Little Help from another person to put on and taking off regular lower body clothing?: A Little 6 Click Score: 19   End of Session Equipment Utilized During Treatment: Gait belt;Rolling walker Nurse Communication: Mobility status  Activity Tolerance: Patient tolerated treatment well Patient left: in bed;with call bell/phone within reach;with bed alarm set;with family/visitor present  OT Visit Diagnosis: Unsteadiness on feet (R26.81);Other abnormalities of gait and mobility (R26.89);Pain Pain - Right/Left: Right Pain - part of body: Leg                Time: 9509-3267 OT Time Calculation (min): 41 min Charges:  OT General Charges $OT Visit: 1 Visit OT Evaluation $OT Eval Low Complexity: 1 Low OT Treatments $Self Care/Home Management : 8-22 mins $Therapeutic Activity: 8-22 mins  Lorre Munroe, OTR/L  Lorre Munroe 03/07/2020, 9:22 AM

## 2020-03-07 NOTE — Anesthesia Postprocedure Evaluation (Signed)
Anesthesia Post Note  Patient: Marketing executive  Procedure(s) Performed: OPEN REDUCTION INTERNAL FIXATION RIGHT BIMALLEOLAR ANKLE FRACTURE (Right Ankle)     Patient location during evaluation: PACU Anesthesia Type: General Level of consciousness: awake and alert Pain management: pain level controlled Vital Signs Assessment: post-procedure vital signs reviewed and stable Respiratory status: spontaneous breathing, nonlabored ventilation, respiratory function stable and patient connected to nasal cannula oxygen Cardiovascular status: blood pressure returned to baseline and stable Postop Assessment: no apparent nausea or vomiting Anesthetic complications: no   No complications documented.  Last Vitals:  Vitals:   03/07/20 0200 03/07/20 0613  BP: 120/76 118/69  Pulse: 80 78  Resp: 17 18  Temp: 37.1 C 36.5 C  SpO2: 98% 99%    Last Pain:  Vitals:   03/07/20 0613  TempSrc: Oral  PainSc:                  Khaleed Holan S

## 2020-03-07 NOTE — Evaluation (Signed)
Physical Therapy Evaluation Patient Details Name: Jill Sanford MRN: [id number] DOB: [birth year] Today's Date: 03/07/2020   History of Present Illness  Pt is a 24 y/o F who presented to ED on 11/5 for an inversion injury to the R ankle while going up the stairs and suffered a R bimaleolar ankle fx. Pt underwent a R ankle ORIF on 11/10. No PMH on file.  Clinical Impression  Pt has stated that she had difficulty navigating crutches at home, so wanted to try to use a walker. Pt was fitted with a youth sized RW and stated that she felt more comfortable using this and wanted to use a RW at home. Pt was educated on the importance of keeping the leg active and exercises in avoid DVTs as well as increase strength in the RLE. Pt was also educated on the importance of preventing knee flexion contracture. Pt would benefit from continued acute care PT in order to increase functional mobility and strength in order to return to PLOF. Voice call interpreter was used for this session.    Follow Up Recommendations No PT follow up    Equipment Recommendations  Rolling walker with 5" wheels (youth sized RW)    Recommendations for Other Services       Precautions / Restrictions Precautions Precautions: Fall Restrictions Weight Bearing Restrictions: Yes RLE Weight Bearing: Non weight bearing      Mobility  Bed Mobility Overal bed mobility: Needs Assistance Bed Mobility: Supine to Sit;Sit to Supine     Supine to sit: Modified independent (Device/Increase time) Sit to supine: Modified independent (Device/Increase time)   General bed mobility comments: Pts sister in law provided assist near end of sit to supine to get leg into bed on pillows Patient Response: Flat affect  Transfers Overall transfer level: Needs assistance Equipment used: Rolling walker (2 wheeled) Transfers: Sit to/from Stand Sit to Stand: Min guard Stand pivot transfers: Min guard       General transfer comment: Pt needed  demonstration of proper hand placement on chair when performing sit to/from stand, pt was placing both hands on the walker  Ambulation/Gait Ambulation/Gait assistance: Supervision Gait Distance (Feet): 160 Feet Assistive device: Rolling walker (2 wheeled) Gait Pattern/deviations: Step-to pattern Gait velocity: fast   General Gait Details: NWB on the R; pt was able to move WNL with walker while maintaining precautions and safety  Stairs            Wheelchair Mobility    Modified Rankin (Stroke Patients Only)       Balance Overall balance assessment: Needs assistance Sitting-balance support: No upper extremity supported;Feet supported Sitting balance-Leahy Scale: Normal     Standing balance support: Bilateral upper extremity supported;During functional activity;Single extremity supported Standing balance-Leahy Scale: Poor Standing balance comment: reliant on bilateral UE on walker in order to maintain NWB precautions of R leg                             Pertinent Vitals/Pain Pain Assessment: Faces Faces Pain Scale: Hurts a little bit Pain Location: R LE Pain Descriptors / Indicators: Grimacing Pain Intervention(s): Limited activity within patient's tolerance;Monitored during session;Repositioned    Home Living Family/patient expects to be discharged to:: Private residence Living Arrangements: Children;Other relatives Available Help at Discharge: Family;Available 24 hours/day Type of Home: House Home Access: Level entry     Home Layout: One level Home Equipment: Crutches (pt doesn't feel safe/having difficulties using the crutches) Additional Comments:  Pt plans to discharge to brother-in-law's house with level entry. Plenty of family to assist with pt and her young child     Prior Function Level of Independence: Independent               Hand Dominance   Dominant Hand: Right    Extremity/Trunk Assessment   Upper Extremity  Assessment Upper Extremity Assessment: Overall WFL for tasks assessed    Lower Extremity Assessment Lower Extremity Assessment: RLE deficits/detail RLE Deficits / Details: RLE at least 3/5, but deficits secondary to R ORIF    Cervical / Trunk Assessment Cervical / Trunk Assessment: Normal  Communication   Communication: Interpreter utilized  Cognition Arousal/Alertness: Awake/alert Behavior During Therapy: Flat affect Overall Cognitive Status: Within Functional Limits for tasks assessed                                        General Comments General comments (skin integrity, edema, etc.): education on elevation if sitting longer than 5 minutes and exercises to perform to prevent DVTs    Exercises General Exercises - Lower Extremity Quad Sets: AROM;Strengthening;Right;5 reps;Supine (pt needed tactile cues) Long Arc Quad: AROM;Strengthening;Right;5 reps;Seated (7/10 pain; pt needed multimodal cues and demonstration to do properly) Hip Flexion/Marching: AROM;Strengthening;Right;15 reps;Seated (multimodal cues used as well as demonstration)   Assessment/Plan    PT Assessment Patient needs continued PT services  PT Problem List Decreased strength;Decreased knowledge of use of DME;Decreased balance;Decreased mobility;Pain       PT Treatment Interventions DME instruction;Gait training;Functional mobility training;Therapeutic exercise;Patient/family education;Balance training    PT Goals (Current goals can be found in the Care Plan section)  Acute Rehab PT Goals Patient Stated Goal: get home to family PT Goal Formulation: With patient Time For Goal Achievement: 03/21/20 Potential to Achieve Goals: Good    Frequency Min 5X/week   Barriers to discharge        Co-evaluation               AM-PAC PT "6 Clicks" Mobility  Outcome Measure Help needed turning from your back to your side while in a flat bed without using bedrails?: None Help needed moving  from lying on your back to sitting on the side of a flat bed without using bedrails?: None Help needed moving to and from a bed to a chair (including a wheelchair)?: A Little Help needed standing up from a chair using your arms (e.g., wheelchair or bedside chair)?: A Little Help needed to walk in hospital room?: A Little Help needed climbing 3-5 steps with a railing? : A Little 6 Click Score: 20    End of Session Equipment Utilized During Treatment: Gait belt Activity Tolerance: Patient tolerated treatment well Patient left: in bed;with call bell/phone within reach;with family/visitor present   PT Visit Diagnosis: Pain;Difficulty in walking, not elsewhere classified (R26.2);Muscle weakness (generalized) (M62.81) Pain - Right/Left: Right Pain - part of body: Ankle and joints of foot    Time: 6759-1638 PT Time Calculation (min) (ACUTE ONLY): 28 min   Charges:   PT Evaluation $PT Eval Low Complexity: 1 Low PT Treatments $Gait Training: 8-22 mins        Jeri Cos, SPT [id number]  Sholom Dulude 03/07/2020, 10:58 AM

## 2020-03-07 NOTE — Progress Notes (Signed)
   Subjective: 1 Day Post-Op Procedure(s) (LRB): OPEN REDUCTION INTERNAL FIXATION RIGHT BIMALLEOLAR ANKLE FRACTURE (Right) Patient reports pain as mild.  Block starting to wear off.   Objective: Vital signs in last 24 hours: Temp:  [97.7 F (36.5 C)-100.1 F (37.8 C)] 97.7 F (36.5 C) (11/11 0613) Pulse Rate:  [78-120] 78 (11/11 0613) Resp:  [17-33] 18 (11/11 0613) BP: (101-152)/(47-79) 118/69 (11/11 0613) SpO2:  [95 %-100 %] 99 % (11/11 1610) Weight:  [72.9 kg] 72.9 kg (11/10 2024)  Intake/Output from previous day: 11/10 0701 - 11/11 0700 In: 1560 [P.O.:60; I.V.:1500] Out: 550 [Urine:500; Blood:50] Intake/Output this shift: No intake/output data recorded.  Recent Labs    03/06/20 1329  HGB 11.9*   Recent Labs    03/06/20 1329  WBC 7.2  RBC 4.06  HCT 36.6  PLT 271   Recent Labs    03/06/20 1329  NA 139  K 4.6  CL 105  CO2 22  BUN 13  CREATININE 0.52  GLUCOSE 88  CALCIUM 9.3   No results for input(s): LABPT, INR in the last 72 hours.  Neurologically intact DG Ankle 2 Views Right  Result Date: 03/06/2020 CLINICAL DATA:  ORIF the ankle EXAM: RIGHT ANKLE - 2 VIEW COMPARISON:  March 01, 2020 FINDINGS: The patient has undergone ORIF of the ankle. There is new plate screw fixation of the distal fibula. There are 2 transcortical screws through the medial malleolus. There is a syndesmotic screw noted. There are expected postsurgical changes. The osseous alignment is significantly improved. IMPRESSION: Status post ORIF of the right ankle with significantly improved osseous alignment. Electronically Signed   By: Katherine Mantle M.D.   On: 03/06/2020 20:05   DG C-Arm 1-60 Min  Result Date: 03/06/2020 CLINICAL DATA:  ORIF EXAM: DG C-ARM 1-60 MIN FLUOROSCOPY TIME:  Fluoroscopy Time:  9 seconds Number of Acquired Spot Images: 5 COMPARISON:  March 01, 2020 FINDINGS: The patient has undergone ORIF of the ankle. There is new plate screw fixation of the distal fibula.  There are 2 transcortical screws through the medial malleolus. There is a syndesmotic screw noted. There are expected postsurgical changes. The osseous alignment is significantly improved. IMPRESSION: Status post ORIF of the ankle with significantly improved osseous alignment. Electronically Signed   By: Katherine Mantle M.D.   On: 03/06/2020 20:06    Assessment/Plan: 1 Day Post-Op Procedure(s) (LRB): OPEN REDUCTION INTERNAL FIXATION RIGHT BIMALLEOLAR ANKLE FRACTURE (Right) Up with therapy, discharge home after therapy. Will need walker most likely. Had crutches but had trouble using them  Jill Sanford 03/07/2020, 8:03 AM

## 2020-03-07 NOTE — TOC Transition Note (Signed)
Transition of Care [facility]) - CM/SW Discharge Note   Patient Details  Name: Jill Sanford MRN: [id number] Date of Birth: 1996-02-12  Transition of Care [facility]) CM/SW Contact:  Epifanio Lesches, RN Phone Number: 03/07/2020, 11:38 AM   Clinical Narrative:    Patient will DC to: home Anticipated DC date: 03/08/11 Family notified: yes Transport by: car        Presents with  R ankle fx. From home with family . PTA independent with ADL's.          - s/p ORIF to R ankle fx 03/06/2020  Per MD patient ready for DC today. RN, patient, patient's family aware of DC.    Pt states only needs youth walker regarding DME. Pt without PCP, no health insurance . NCM shared [facility]... patient interested. Follow up appointment arranged.  [facility]   [phone] [address] [place] [address]    Next Steps: Go on 03/19/2020    Instructions: 10:00 am with Dr. Ricky Stabs, opst hospital follow up and to establish primary care     Pt without Rx med concerns or affordability. Hospital f/u noted on AVS.  RNCM will sign off for now as intervention is no longer needed. Please consult [place] again if new needs arise.  Final next level of care: Home/Self Care Barriers to Discharge: No Barriers Identified   Patient Goals and CMS Choice        Discharge Placement                       Discharge Plan and Services                                     Social Determinants of Health (SDOH) Interventions     Readmission Risk Interventions No flowsheet data found.

## 2020-03-07 NOTE — Progress Notes (Signed)
D/C education given to patient and family via interpreter. All questions answered. IV d/c'd, patient d/c'd without distress.

## 2020-03-08 DIAGNOSIS — S93439A Sprain of tibiofibular ligament of unspecified ankle, initial encounter: Secondary | ICD-10-CM | POA: Diagnosis present

## 2020-03-08 DIAGNOSIS — S82851A Displaced trimalleolar fracture of right lower leg, initial encounter for closed fracture: Secondary | ICD-10-CM | POA: Diagnosis present

## 2020-03-12 NOTE — Discharge Summary (Deleted)
Patient ID: Jill Sanford MRN: [id number] DOB/AGE: 05-15-1995 24 y.o.  Admit date: 03/06/2020 Discharge date: 03/07/2020  Admission Diagnoses:  Active Problems:   Closed right ankle fracture   Closed displaced trimalleolar fracture of right ankle   Acute disruption of syndesmosis of ankle joint   Discharge Diagnoses:  Active Problems:   Closed right ankle fracture   Closed displaced trimalleolar fracture of right ankle   Acute disruption of syndesmosis of ankle joint  status post Procedure(s): OPEN REDUCTION INTERNAL FIXATION RIGHT BIMALLEOLAR ANKLE FRACTURE  History reviewed. No pertinent past medical history.  Surgeries: Procedure(s): OPEN REDUCTION INTERNAL FIXATION RIGHT BIMALLEOLAR ANKLE FRACTURE on 03/06/2020   Consultants:   Discharged Condition: Improved  Hospital Course: Jill Sanford is an 24 y.o. female who was admitted 03/06/2020 for operative treatment of right ankle fracture. Patient failed conservative treatments (please see the history and physical for the specifics) and had severe unremitting pain that affects sleep, daily activities and work/hobbies. After pre-op clearance, the patient was taken to the operating room on 03/06/2020 and underwent  Procedure(s): OPEN REDUCTION INTERNAL FIXATION RIGHT BIMALLEOLAR ANKLE FRACTURE.    Patient was given perioperative antibiotics:  Anti-infectives (From admission, onward)   Start     Dose/Rate Route Frequency Ordered Stop   03/07/20 0600  ceFAZolin (ANCEF) IVPB 2g/100 mL premix        2 g 200 mL/hr over 30 Minutes Intravenous On call to O.R. 03/06/20 1328 03/06/20 1736       Patient was given sequential compression devices and early ambulation to prevent DVT.   Patient benefited maximally from hospital stay and there were no complications. At the time of discharge, the patient was urinating/moving their bowels without difficulty, tolerating a regular diet, pain is controlled with oral pain medications  and they have been cleared by PT/OT.   Recent vital signs: No data found.   Recent laboratory studies: No results for input(s): WBC, HGB, HCT, PLT, NA, K, CL, CO2, BUN, CREATININE, GLUCOSE, INR, CALCIUM in the last 72 hours.  Invalid input(s): PT, 2   Discharge Medications:   Allergies as of 03/07/2020   No Known Allergies     Medication List    STOP taking these medications   ibuprofen 600 MG tablet Commonly known as: ADVIL     TAKE these medications   aspirin 325 MG tablet Take 1 tablet (325 mg total) by mouth daily.   oxyCODONE-acetaminophen 5-325 MG tablet Commonly known as: Percocet Take 1 tablet by mouth every 4 (four) hours as needed for severe pain. What changed:   how much to take  when to take this       Diagnostic Studies: DG Ankle 2 Views Right  Result Date: 03/06/2020 CLINICAL DATA:  ORIF the ankle EXAM: RIGHT ANKLE - 2 VIEW COMPARISON:  March 01, 2020 FINDINGS: The patient has undergone ORIF of the ankle. There is new plate screw fixation of the distal fibula. There are 2 transcortical screws through the medial malleolus. There is a syndesmotic screw noted. There are expected postsurgical changes. The osseous alignment is significantly improved. IMPRESSION: Status post ORIF of the right ankle with significantly improved osseous alignment. Electronically Signed   By: Katherine Mantle M.D.   On: 03/06/2020 20:05   DG Ankle Complete Right  Result Date: 03/01/2020 CLINICAL DATA:  Fall for EXAM: RIGHT ANKLE - COMPLETE 3+ VIEW COMPARISON:  None. FINDINGS: There is a comminuted displaced fracture seen through the medial malleolus and distal fibula. There is widening of  the medial clear space measuring approximately 8 mm. There is significant surrounding soft tissue swelling. IMPRESSION: Comminuted displaced fractures through the medial malleolus and distal fibula with 8 mm of medial clear space widening. There is overlying soft tissue swelling. Electronically  Signed   By: Jonna Clark M.D.   On: 03/01/2020 20:05   DG C-Arm 1-60 Min  Result Date: 03/06/2020 CLINICAL DATA:  ORIF EXAM: DG C-ARM 1-60 MIN FLUOROSCOPY TIME:  Fluoroscopy Time:  9 seconds Number of Acquired Spot Images: 5 COMPARISON:  March 01, 2020 FINDINGS: The patient has undergone ORIF of the ankle. There is new plate screw fixation of the distal fibula. There are 2 transcortical screws through the medial malleolus. There is a syndesmotic screw noted. There are expected postsurgical changes. The osseous alignment is significantly improved. IMPRESSION: Status post ORIF of the ankle with significantly improved osseous alignment. Electronically Signed   By: Katherine Mantle M.D.   On: 03/06/2020 20:06       Follow-up Information    [facility]. Go on 03/19/2020.   Why: 10:00 am with Dr. Ricky Stabs, opst hospital follow up and to establish primary care Contact information: [address] [place] [address] [phone]       [facility]. Go on 03/13/2020.   Why:  Patient to follow up with Ortho MD 03/13/2020 at 10:00 am Contact information: [facility] [address] [place], [place] [address] [phone]              Discharge Plan:  discharge to home  Disposition:     Signed: Zonia Kief  03/12/2020, 3:15 PM

## 2020-03-13 ENCOUNTER — Ambulatory Visit: Payer: Self-pay

## 2020-03-13 ENCOUNTER — Other Ambulatory Visit: Payer: Self-pay

## 2020-03-13 ENCOUNTER — Ambulatory Visit (INDEPENDENT_AMBULATORY_CARE_PROVIDER_SITE_OTHER): Payer: Self-pay | Admitting: Orthopaedic Surgery

## 2020-03-13 ENCOUNTER — Encounter: Payer: Self-pay | Admitting: Orthopaedic Surgery

## 2020-03-13 VITALS — Ht 60.0 in | Wt 160.0 lb

## 2020-03-13 DIAGNOSIS — S82841A Displaced bimalleolar fracture of right lower leg, initial encounter for closed fracture: Secondary | ICD-10-CM

## 2020-03-13 NOTE — Progress Notes (Signed)
Patient ID: Jill Sanford, female   DOB: January 13, 1996, 24 y.o.   MRN: [id number]    Virtual Visit via Telephone Note  I connected with Jill Sanford on 03/20/20 at  3:50 PM EST by telephone and verified that I am speaking with the correct person using two identifiers.  Location: Patient: [employer] [facility] Provider: Georgian Co, PA-C   I discussed the limitations, risks, security and privacy concerns of performing an evaluation and management service by telephone and the availability of in person appointments. I also discussed with the patient that there may be a patient responsible charge related to this service. The patient expressed understanding and agreed to proceed.  PATIENT visit by telephone virtually in the context of Covid-19 pandemic. Patient location:  home My Location:  [facility] office Persons on the call:  Me and the patient, screened by Carolynne Edouard.  Family member translating.         Observations/Objective:    Patient was seen in ED 03/01/2020 after sustaining an inversion ankle injury while climbing stairs.    R ankle xray findings: There is a comminuted displaced fracture seen through the medial malleolus and distal fibula. There is widening of the medial clear space measuring approximately 8 mm. There is significant surrounding soft tissue swelling. IMPRESSION: Comminuted displaced fractures through the medial malleolus and distal fibula with 8 mm of medial clear space widening. There is overlying soft tissue swelling.  Follow up and surgical correction scheduled. She is doing much better.  Has appt with ortho next week.  Some pain but much improved.  No fever.  Appetite is good.  Rarely taking oxy.     History of Present Illness: Admit date: 03/06/2020 Discharge date: 03/07/2020 Admission Diagnoses:  Active Problems:   Closed right ankle fracture   Closed displaced trimalleolar fracture of right ankle   Acute disruption of syndesmosis of ankle  joint   Discharge Diagnoses:  Active Problems:   Closed right ankle fracture   Closed displaced trimalleolar fracture of right ankle   Acute disruption of syndesmosis of ankle joint  status post Procedure(s): OPEN REDUCTION INTERNAL FIXATION RIGHT BIMALLEOLAR ANKLE FRACTURE  Hospital Course: Jill Sanford is an 24 y.o. female who was admitted 03/06/2020 for operative treatment of right ankle fracture. Patient failed conservative treatments (please see the history and physical for the specifics) and had severe unremitting pain that affects sleep, daily activities and work/hobbies. After pre-op clearance, the patient was taken to the operating room on 03/06/2020 and underwent  Procedure(s): OPEN REDUCTION INTERNAL FIXATION RIGHT BIMALLEOLAR ANKLE FRACTURE.    Patient was given sequential compression devices and early ambulation to prevent DVT.   Patient benefited maximally from hospital stay and there were no complications. At the time of discharge, the patient was urinating/moving their bowels without difficulty, tolerating a regular diet, pain is controlled with oral pain medications and they have been cleared by PT/OT.    Assessment and Plan: 1. Influenza vaccine refused  2. Closed fracture of right ankle, initial encounter Continue with RICE therapy and f/up with ortho as planned - naproxen (NAPROSYN) 500 MG tablet; Take 1 tablet (500 mg total) by mouth 2 (two) times daily with a meal. Prn pain  Dispense: 60 tablet; Refill: 0  3. Pruritus - cetirizine (ZYRTEC) 10 MG tablet; Take 1 tablet (10 mg total) by mouth daily. Prn itching  Dispense: 30 tablet; Refill: 11  4. Hospital discharge follow-up    Follow Up Instructions: Assign PCP in 2-3 months;  Sooner if needed  I discussed the assessment and treatment plan with the patient. The patient was provided an opportunity to ask questions and all were answered. The patient agreed with the plan and demonstrated an understanding  of the instructions.   The patient was advised to call back or seek an in-person evaluation if the symptoms worsen or if the condition fails to improve as anticipated.  I provided 16 minutes of non-face-to-face time during this encounter.   Georgian Co, PA-C

## 2020-03-13 NOTE — Discharge Summary (Signed)
Patient ID: Jill Sanford MRN: [id number] DOB/AGE: [birth year] 24 y.o.  Admit date: 03/06/2020 Discharge date: 03/07/2020 Admission Diagnoses:  Active Problems:   Closed right ankle fracture   Closed displaced trimalleolar fracture of right ankle   Acute disruption of syndesmosis of ankle joint   Discharge Diagnoses:  Active Problems:   Closed right ankle fracture   Closed displaced trimalleolar fracture of right ankle   Acute disruption of syndesmosis of ankle joint  status post Procedure(s): OPEN REDUCTION INTERNAL FIXATION RIGHT BIMALLEOLAR ANKLE FRACTURE  History reviewed. No pertinent past medical history.  Surgeries: Procedure(s): OPEN REDUCTION INTERNAL FIXATION RIGHT BIMALLEOLAR ANKLE FRACTURE on 03/06/2020   Consultants:   Discharged Condition: Improved  Hospital Course: Jill Sanford is an 24 y.o. female who was admitted 03/06/2020 for operative treatment of right ankle fracture. Patient failed conservative treatments (please see the history and physical for the specifics) and had severe unremitting pain that affects sleep, daily activities and work/hobbies. After pre-op clearance, the patient was taken to the operating room on 03/06/2020 and underwent  Procedure(s): OPEN REDUCTION INTERNAL FIXATION RIGHT BIMALLEOLAR ANKLE FRACTURE.    Patient was given perioperative antibiotics:  Anti-infectives (From admission, onward)   Start     Dose/Rate Route Frequency Ordered Stop   03/07/20 0600  ceFAZolin (ANCEF) IVPB 2g/100 mL premix        2 g 200 mL/hr over 30 Minutes Intravenous On call to O.R. 03/06/20 1328 03/06/20 1736       Patient was given sequential compression devices and early ambulation to prevent DVT.   Patient benefited maximally from hospital stay and there were no complications. At the time of discharge, the patient was urinating/moving their bowels without difficulty, tolerating a regular diet, pain is controlled with oral pain medications and  they have been cleared by PT/OT.   Recent vital signs: No data found.   Recent laboratory studies: No results for input(s): WBC, HGB, HCT, PLT, NA, K, CL, CO2, BUN, CREATININE, GLUCOSE, INR, CALCIUM in the last 72 hours.  Invalid input(s): PT, 2   Discharge Medications:   Allergies as of 03/07/2020   No Known Allergies     Medication List    STOP taking these medications   ibuprofen 600 MG tablet Commonly known as: ADVIL     TAKE these medications   aspirin 325 MG tablet Take 1 tablet (325 mg total) by mouth daily.   oxyCODONE-acetaminophen 5-325 MG tablet Commonly known as: Percocet Take 1 tablet by mouth every 4 (four) hours as needed for severe pain. What changed:   how much to take  when to take this       Diagnostic Studies: DG Ankle 2 Views Right  Result Date: 03/06/2020 CLINICAL DATA:  ORIF the ankle EXAM: RIGHT ANKLE - 2 VIEW COMPARISON:  March 01, 2020 FINDINGS: The patient has undergone ORIF of the ankle. There is new plate screw fixation of the distal fibula. There are 2 transcortical screws through the medial malleolus. There is a syndesmotic screw noted. There are expected postsurgical changes. The osseous alignment is significantly improved. IMPRESSION: Status post ORIF of the right ankle with significantly improved osseous alignment. Electronically Signed   By: Katherine Mantle M.D.   On: 03/06/2020 20:05   DG Ankle Complete Right  Result Date: 03/01/2020 CLINICAL DATA:  Fall for EXAM: RIGHT ANKLE - COMPLETE 3+ VIEW COMPARISON:  None. FINDINGS: There is a comminuted displaced fracture seen through the medial malleolus and distal fibula. There is widening of the  medial clear space measuring approximately 8 mm. There is significant surrounding soft tissue swelling. IMPRESSION: Comminuted displaced fractures through the medial malleolus and distal fibula with 8 mm of medial clear space widening. There is overlying soft tissue swelling. Electronically Signed    By: Jonna Clark M.D.   On: 03/01/2020 20:05   DG C-Arm 1-60 Min  Result Date: 03/06/2020 CLINICAL DATA:  ORIF EXAM: DG C-ARM 1-60 MIN FLUOROSCOPY TIME:  Fluoroscopy Time:  9 seconds Number of Acquired Spot Images: 5 COMPARISON:  March 01, 2020 FINDINGS: The patient has undergone ORIF of the ankle. There is new plate screw fixation of the distal fibula. There are 2 transcortical screws through the medial malleolus. There is a syndesmotic screw noted. There are expected postsurgical changes. The osseous alignment is significantly improved. IMPRESSION: Status post ORIF of the ankle with significantly improved osseous alignment. Electronically Signed   By: Katherine Mantle M.D.   On: 03/06/2020 20:06       Follow-up Information    [facility]. Go on 03/19/2020.   Why: 10:00 am with Dr. Ricky Stabs, opst hospital follow up and to establish primary care Contact information: [address] [place] [address] [phone]       [facility]. Go on 03/13/2020.   Why:  Patient to follow up with Ortho MD 03/13/2020 at 10:00 am Contact information: [facility] [address] [place], [place] [address] [phone]              Discharge Plan:  discharge to home  Disposition:     Signed: Zonia Kief  03/13/2020, 11:32 AM

## 2020-03-13 NOTE — Progress Notes (Signed)
Post-ORIF right trimalleolar ankle fracture with syndesmosis screw placement.  Pain is 5 out of 10 she has plenty of pain medication left.  New dressing applied cam boot applied she still nonweightbearing return 1 week for incision check possible staple removal.  She had fracture blisters medially.  No cellulitis.  She will continue to keep her foot elevated for the swelling and return in 1 week.

## 2020-03-20 ENCOUNTER — Ambulatory Visit: Payer: Self-pay | Attending: Physician Assistant | Admitting: Physician Assistant

## 2020-03-20 ENCOUNTER — Other Ambulatory Visit: Payer: Self-pay

## 2020-03-20 DIAGNOSIS — Z09 Encounter for follow-up examination after completed treatment for conditions other than malignant neoplasm: Secondary | ICD-10-CM

## 2020-03-20 DIAGNOSIS — S82891A Other fracture of right lower leg, initial encounter for closed fracture: Secondary | ICD-10-CM

## 2020-03-20 DIAGNOSIS — Z2821 Immunization not carried out because of patient refusal: Secondary | ICD-10-CM | POA: Insufficient documentation

## 2020-03-20 DIAGNOSIS — L299 Pruritus, unspecified: Secondary | ICD-10-CM

## 2020-03-20 MED ORDER — NAPROXEN 500 MG PO TABS: 500.0000 mg | ORAL_TABLET | Freq: Two times a day (BID) | ORAL | 0 refills | Status: DC

## 2020-03-20 MED ORDER — CETIRIZINE HCL 10 MG PO TABS: 10.0000 mg | ORAL_TABLET | Freq: Every day | ORAL | 11 refills | Status: AC

## 2020-03-26 ENCOUNTER — Ambulatory Visit (INDEPENDENT_AMBULATORY_CARE_PROVIDER_SITE_OTHER): Payer: Self-pay | Admitting: Orthopaedic Surgery

## 2020-03-26 ENCOUNTER — Encounter: Payer: Self-pay | Admitting: Orthopaedic Surgery

## 2020-03-26 ENCOUNTER — Other Ambulatory Visit: Payer: Self-pay

## 2020-03-26 VITALS — Ht 60.0 in | Wt 160.0 lb

## 2020-03-26 DIAGNOSIS — S93431A Sprain of tibiofibular ligament of right ankle, initial encounter: Secondary | ICD-10-CM

## 2020-03-26 DIAGNOSIS — S93439A Sprain of tibiofibular ligament of unspecified ankle, initial encounter: Secondary | ICD-10-CM

## 2020-03-26 DIAGNOSIS — S82851D Displaced trimalleolar fracture of right lower leg, subsequent encounter for closed fracture with routine healing: Secondary | ICD-10-CM

## 2020-03-26 NOTE — Progress Notes (Signed)
   Post-Op Visit Note   Patient: Jill Sanford           Date of Birth: 30-Dec-1995           MRN: [id number] Visit Date: 03/26/2020 PCP: Patient, No Pcp Per   Assessment & Plan: Staples removed Steri-Strips applied area where she had a fracture blister is healing nicely.  Cam boot which she brought with her was applied.  We will check her back again in 7 weeks for removal of her syndesmotic screw.  She will need an x-ray after Dr. Ophelia Charter applies a BB for localization.  Responses we will see her the week before to make sure her skin over the ankle looks good if any questions they will call me and send me a photo picture or I can call them and see photo picture that they can send to my phone. Biomet square screwdriver will be needed for removal and we will call the day before Josh called to make sure the screwdriver is here.  She remains nonweightbearing.  Chief Complaint:  Chief Complaint  Patient presents with  . Right Ankle - Follow-up    03/06/2020 ORIF right bimalleolar fracture   Visit Diagnoses:  1. Acute disruption of syndesmosis of ankle joint   2. Closed displaced trimalleolar fracture of right ankle with routine healing, subsequent encounter     Plan: Return in 7 weeks with BB applied and then standard three-view x-rays of the right ankle followed by syndesmotic screw removal.  Still nonweightbearing and she has been compliant and nonweightbearing.  Follow-Up Instructions: Return in about 7 weeks (around 05/14/2020).   Orders:  No orders of the defined types were placed in this encounter.  No orders of the defined types were placed in this encounter.   Imaging: No results found.  PMFS History: Patient Active Problem List   Diagnosis Date Noted  . Influenza vaccine refused 03/20/2020  . Closed displaced trimalleolar fracture of right ankle 03/08/2020  . Acute disruption of syndesmosis of ankle joint 03/08/2020  . Closed right ankle fracture 03/06/2020   No past  medical history on file.  No family history on file.  Past Surgical History:  Procedure Laterality Date  . ORIF ANKLE FRACTURE Right 03/06/2020   Procedure: OPEN REDUCTION INTERNAL FIXATION RIGHT BIMALLEOLAR ANKLE FRACTURE;  Surgeon: Eldred Manges, MD;  Location: [facility] OR;  Service: Orthopedics;  Laterality: Right;   Social History   Occupational History  . Not on file  Tobacco Use  . Smoking status: Never Smoker  . Smokeless tobacco: Never Used  Substance and Sexual Activity  . Alcohol use: Not Currently  . Drug use: Not on file  . Sexual activity: Not on file

## 2020-04-08 ENCOUNTER — Telehealth: Payer: Self-pay | Admitting: Orthopaedic Surgery

## 2020-04-08 NOTE — Telephone Encounter (Signed)
Per leslie she will be here tomorrow @1015 

## 2020-04-08 NOTE — Telephone Encounter (Signed)
Interpreter called for PT, Pt is having swelling and wound is leakiNG. [phone]

## 2020-04-08 NOTE — Telephone Encounter (Signed)
I called the rep for the patient Jill Au May), I advised that she should be seen so if we need to send in a culture we can do so.   I advised that currently he has a 8:15 and 10:15 tomorrow morning she states that she needs to get her transportation to the appt. And she will call be ck to let us know which is best,.

## 2020-04-09 ENCOUNTER — Other Ambulatory Visit: Payer: Self-pay

## 2020-04-09 ENCOUNTER — Ambulatory Visit (INDEPENDENT_AMBULATORY_CARE_PROVIDER_SITE_OTHER): Payer: Self-pay | Admitting: Orthopaedic Surgery

## 2020-04-09 ENCOUNTER — Encounter: Payer: Self-pay | Admitting: Orthopaedic Surgery

## 2020-04-09 VITALS — BP 131/83 | HR 81 | Temp 97.9°F

## 2020-04-09 DIAGNOSIS — S82891D Other fracture of right lower leg, subsequent encounter for closed fracture with routine healing: Secondary | ICD-10-CM

## 2020-04-09 MED ORDER — SULFAMETHOXAZOLE-TRIMETHOPRIM 800-160 MG PO TABS: 1.0000 | ORAL_TABLET | Freq: Two times a day (BID) | ORAL | 0 refills | Status: AC

## 2020-04-09 NOTE — Progress Notes (Signed)
   Post-Op Visit Note   Patient: Jill Sanford           Date of Birth: 26-Feb-1996           MRN: [id number] Visit Date: 04/09/2020 PCP: Patient, No Pcp Per   Assessment & Plan: Patient returns she has had trace drainage laterally and has swelling her foot and ankle.  She is taking care of her 4 children but has a sister-in-law available also to help.  She has no chills or fever no cellulitis around the pinpoint spot where she had had some drainage.  No pus is expressible currently but a small dot is seen on the dressing that had been there.  New dressing applied Ace wrap.  She needs some VIVO socks for compression.  Continue cam boot.  Septra 1 p.o. twice daily x7 days prescribed.  Recheck January 11 as planned.  Chief Complaint:  Chief Complaint  Patient presents with  . Right Ankle - Follow-up, Pain   Visit Diagnoses:  1. Closed fracture of right ankle with routine healing, subsequent encounter     Plan: As above.  If she has increased drainage they will call if she runs any fever.  Follow-Up Instructions: No follow-ups on file.   Orders:  No orders of the defined types were placed in this encounter.  Meds ordered this encounter  Medications  . sulfamethoxazole-trimethoprim (BACTRIM DS) 800-160 MG tablet    Sig: Take 1 tablet by mouth 2 (two) times daily.    Dispense:  14 tablet    Refill:  0    Imaging: No results found.  PMFS History: Patient Active Problem List   Diagnosis Date Noted  . Influenza vaccine refused 03/20/2020  . Closed displaced trimalleolar fracture of right ankle 03/08/2020  . Acute disruption of syndesmosis of ankle joint 03/08/2020  . Closed right ankle fracture 03/06/2020   History reviewed. No pertinent past medical history.  History reviewed. No pertinent family history.  Past Surgical History:  Procedure Laterality Date  . ORIF ANKLE FRACTURE Right 03/06/2020   Procedure: OPEN REDUCTION INTERNAL FIXATION RIGHT BIMALLEOLAR ANKLE  FRACTURE;  Surgeon: Eldred Manges, MD;  Location: [facility] OR;  Service: Orthopedics;  Laterality: Right;   Social History   Occupational History  . Not on file  Tobacco Use  . Smoking status: Never Smoker  . Smokeless tobacco: Never Used  Substance and Sexual Activity  . Alcohol use: Not Currently  . Drug use: Not on file  . Sexual activity: Not on file

## 2020-05-03 ENCOUNTER — Telehealth: Payer: Self-pay | Admitting: Orthopaedic Surgery

## 2020-05-03 NOTE — Telephone Encounter (Signed)
I called Jill Sanford. She states that she thought at patient's last appointment, Dr. Ophelia Charter told her they would not need to see her on 05/07/2020 if she could email him pictures of her ankle and it looked ok. That way, they would only have to come in for the office surgery to remove syndesmotic screw on 05/14/2020. I gave my email address to send images to and advised I would speak with Dr .Ophelia Charter and cal her back to let her know whether to keep 05/07/2020 appt.

## 2020-05-03 NOTE — Telephone Encounter (Signed)
Verlon Au May called on behalf of the pt stating Dr.Yates nurse asked to have some images sent over. She would like a CB to discuss more.  [phone]

## 2020-05-06 NOTE — Telephone Encounter (Signed)
Dr. Ophelia Charter reviewed images sent by email and advised ok to cancel appt for 05/07/2020 and have patient come in to office for screw removal on 05/14/2020.  I sent email to [facility] to advise. I canceled appointment for 05/07/2020.

## 2020-05-07 ENCOUNTER — Ambulatory Visit: Payer: Self-pay | Admitting: Orthopaedic Surgery

## 2020-05-14 ENCOUNTER — Ambulatory Visit (INDEPENDENT_AMBULATORY_CARE_PROVIDER_SITE_OTHER): Payer: Self-pay

## 2020-05-14 ENCOUNTER — Ambulatory Visit: Payer: Self-pay | Admitting: Orthopaedic Surgery

## 2020-05-14 ENCOUNTER — Ambulatory Visit (INDEPENDENT_AMBULATORY_CARE_PROVIDER_SITE_OTHER): Payer: Self-pay | Admitting: Orthopaedic Surgery

## 2020-05-14 ENCOUNTER — Other Ambulatory Visit: Payer: Self-pay

## 2020-05-14 DIAGNOSIS — S82891D Other fracture of right lower leg, subsequent encounter for closed fracture with routine healing: Secondary | ICD-10-CM

## 2020-05-14 NOTE — Progress Notes (Signed)
   Post-Op Visit Note   Patient: Jill Sanford           Date of Birth: 04-27-1996           MRN: [id number] Visit Date: 05/14/2020 PCP: Patient, No Pcp Per   Assessment & Plan: Right ankle fixation trimalleolar with syndesmotic disruption.  Localization x-ray obtained and reviewed and BB was immediately adjacent to the planned syndesmotic screw removal.  Betadine prep sterile field technique lidocaine 2 cc used stab incision freer elevator and then screw removal.  Two 3-0 nylon sutures were placed sterilely and patient will return in 1 week for suture removal.  She can begin weightbearing as tolerated with her cam boot.  Chief Complaint:  Chief Complaint  Patient presents with  . Right Ankle - Follow-up    Here for screw removal in ankle   Visit Diagnoses:  1. Closed fracture of right ankle with routine healing, subsequent encounter     Plan: Return 1 week for wound check and probable suture removal.  Follow-Up Instructions: No follow-ups on file.   Orders:  Orders Placed This Encounter  Procedures  . XR Ankle Complete Right   No orders of the defined types were placed in this encounter.   Imaging: XR Ankle Complete Right  Result Date: 05/14/2020 2 view x-rays right ankle demonstrates BB adjacent to right ankle trimalleolar ankle fixation with syndesmotic screw.  Previous couple millimeters from the syndesmotic screw head. Impression: Trimalleolar ankle fracture with BB localization for screw removal.   PMFS History: Patient Active Problem List   Diagnosis Date Noted  . Influenza vaccine refused 03/20/2020  . Closed displaced trimalleolar fracture of right ankle 03/08/2020  . Acute disruption of syndesmosis of ankle joint 03/08/2020  . Closed right ankle fracture 03/06/2020   No past medical history on file.  No family history on file.  Past Surgical History:  Procedure Laterality Date  . ORIF ANKLE FRACTURE Right 03/06/2020   Procedure: OPEN REDUCTION INTERNAL  FIXATION RIGHT BIMALLEOLAR ANKLE FRACTURE;  Surgeon: Eldred Manges, MD;  Location: [facility] OR;  Service: Orthopedics;  Laterality: Right;   Social History   Occupational History  . Not on file  Tobacco Use  . Smoking status: Never Smoker  . Smokeless tobacco: Never Used  Substance and Sexual Activity  . Alcohol use: Not Currently  . Drug use: Not on file  . Sexual activity: Not on file

## 2020-05-21 ENCOUNTER — Other Ambulatory Visit: Payer: Self-pay

## 2020-05-21 ENCOUNTER — Encounter: Payer: Self-pay | Admitting: Orthopaedic Surgery

## 2020-05-21 ENCOUNTER — Ambulatory Visit (INDEPENDENT_AMBULATORY_CARE_PROVIDER_SITE_OTHER): Payer: Self-pay | Admitting: Orthopaedic Surgery

## 2020-05-21 VITALS — BP 126/81 | Ht 60.0 in | Wt 160.0 lb

## 2020-05-21 DIAGNOSIS — S82851D Displaced trimalleolar fracture of right lower leg, subsequent encounter for closed fracture with routine healing: Secondary | ICD-10-CM

## 2020-05-21 DIAGNOSIS — S82891A Other fracture of right lower leg, initial encounter for closed fracture: Secondary | ICD-10-CM

## 2020-05-21 MED ORDER — NAPROXEN 500 MG PO TABS: 500.0000 mg | ORAL_TABLET | Freq: Two times a day (BID) | ORAL | 0 refills | Status: AC

## 2020-05-21 NOTE — Progress Notes (Signed)
   Post-Op Visit Note   Patient: Jill Sanford           Date of Birth: [birth year]           MRN: [id number] Visit Date: 05/21/2020 PCP: Patient, No Pcp Per   Assessment & Plan:continue WBAT increase with boot and progress into tennis shoe. ROV in one month if still having problems.   Chief Complaint:  Chief Complaint  Patient presents with  . Right Ankle - Follow-up    05/14/2020 syndesmotic screw removal   Visit Diagnoses:  1. Closed displaced trimalleolar fracture of right ankle with routine healing, subsequent encounter   2. Closed fracture of right ankle, initial encounter     Plan: progress as above.   Follow-Up Instructions: Return in about 1 month (around 06/21/2020).   Orders:  No orders of the defined types were placed in this encounter.  Meds ordered this encounter  Medications  . naproxen (NAPROSYN) 500 MG tablet    Sig: Take 1 tablet (500 mg total) by mouth 2 (two) times daily with a meal. Prn pain    Dispense:  60 tablet    Refill:  0    Imaging: No results found.  PMFS History: Patient Active Problem List   Diagnosis Date Noted  . Influenza vaccine refused 03/20/2020  . Closed displaced trimalleolar fracture of right ankle 03/08/2020  . Acute disruption of syndesmosis of ankle joint 03/08/2020  . Closed right ankle fracture 03/06/2020   No past medical history on file.  No family history on file.  Past Surgical History:  Procedure Laterality Date  . ORIF ANKLE FRACTURE Right 03/06/2020   Procedure: OPEN REDUCTION INTERNAL FIXATION RIGHT BIMALLEOLAR ANKLE FRACTURE;  Surgeon: Eldred Manges, MD;  Location: [facility] OR;  Service: Orthopedics;  Laterality: Right;   Social History   Occupational History  . Not on file  Tobacco Use  . Smoking status: Never Smoker  . Smokeless tobacco: Never Used  Substance and Sexual Activity  . Alcohol use: Not Currently  . Drug use: Not on file  . Sexual activity: Not on file

## 2020-06-18 ENCOUNTER — Ambulatory Visit: Payer: Self-pay | Admitting: Orthopaedic Surgery

## 2020-07-09 ENCOUNTER — Encounter: Payer: Self-pay | Admitting: Orthopaedic Surgery

## 2020-07-09 ENCOUNTER — Other Ambulatory Visit: Payer: Self-pay

## 2020-07-09 ENCOUNTER — Ambulatory Visit (INDEPENDENT_AMBULATORY_CARE_PROVIDER_SITE_OTHER): Payer: Medicaid Other | Admitting: Orthopaedic Surgery

## 2020-07-09 VITALS — BP 114/81 | HR 76 | Ht 60.0 in | Wt 160.0 lb

## 2020-07-09 DIAGNOSIS — S82891D Other fracture of right lower leg, subsequent encounter for closed fracture with routine healing: Secondary | ICD-10-CM | POA: Diagnosis not present

## 2020-07-09 NOTE — Progress Notes (Signed)
Office Visit Note   Patient: Jill Sanford           Date of Birth: Aug 27, 1995           MRN: [id number] Visit Date: 07/09/2020              Requested by: No referring provider defined for this encounter. PCP: Patient, No Pcp Per   Assessment & Plan: Visit Diagnoses:  1. Closed fracture of right ankle with routine healing, subsequent encounter     Plan: Formal physical therapy [facility] outpatient since she lives in [place] and interpreter mentioned that do count in outpatient physical therapy on [address] which is close to where she lives.  They can work on heel cord stretching and proper gait sequence.  She needs to prop her foot up to help with the swelling.  Return as needed.  Follow-Up Instructions: Return if symptoms worsen or fail to improve.   Orders:  Orders Placed This Encounter  Procedures   Ambulatory referral to Physical Therapy   No orders of the defined types were placed in this encounter.     Procedures: No procedures performed   Clinical Data: No additional findings.   Subjective: Chief Complaint  Patient presents with   Right Ankle - Follow-up    HPI 25 year old female follow-up right trimalleolar ankle fracture surgery 03/06/20.  She take care of her children and  cooking.  Phone interpreter since she is from [place].  She is ambulatory with tight heel cord gait keeps her foot and external rotation denies pain.  Review of Systems updated unchanged.   Objective: Vital Signs: BP 114/81 (BP Location: Left Arm, Patient Position: Sitting)    Pulse 76    Ht 5' (1.524 m)    Wt 160 lb (72.6 kg)    BMI 31.25 kg/m   Physical Exam Constitutional:      Appearance: She is well-developed.  HENT:     Head: Normocephalic.     Right Ear: External ear normal.     Left Ear: External ear normal.  Eyes:     Pupils: Pupils are equal, round, and reactive to light.  Neck:     Thyroid: No thyromegaly.     Trachea: No tracheal deviation.   Cardiovascular:     Rate and Rhythm: Normal rate.  Pulmonary:     Effort: Pulmonary effort is normal.  Abdominal:     Palpations: Abdomen is soft.  Skin:    General: Skin is warm and dry.  Neurological:     Mental Status: She is alert and oriented to person, place, and time.  Psychiatric:        Behavior: Behavior normal.     Ortho Exam patient needs some physical therapy needs to work on heel cord stretching and gait.  We discussed standing on a step or curb letting her heel stretch as well as heel cord stretches with her hands against the wall.  Specialty Comments:  No specialty comments available.  Imaging: No results found.   PMFS History: Patient Active Problem List   Diagnosis Date Noted   Influenza vaccine refused 03/20/2020   Closed displaced trimalleolar fracture of right ankle 03/08/2020   Acute disruption of syndesmosis of ankle joint 03/08/2020   Closed right ankle fracture 03/06/2020   No past medical history on file.  No family history on file.  Past Surgical History:  Procedure Laterality Date   ORIF ANKLE FRACTURE Right 03/06/2020   Procedure: OPEN REDUCTION INTERNAL FIXATION  RIGHT BIMALLEOLAR ANKLE FRACTURE;  Surgeon: Eldred Manges, MD;  Location: [facility] OR;  Service: Orthopedics;  Laterality: Right;   Social History   Occupational History   Not on file  Tobacco Use   Smoking status: Never Smoker   Smokeless tobacco: Never Used  Substance and Sexual Activity   Alcohol use: Not Currently   Drug use: Not on file   Sexual activity: Not on file

## 2020-08-01 ENCOUNTER — Telehealth: Payer: Self-pay | Admitting: Orthopaedic Surgery

## 2020-08-01 DIAGNOSIS — S82891D Other fracture of right lower leg, subsequent encounter for closed fracture with routine healing: Secondary | ICD-10-CM

## 2020-08-01 NOTE — Telephone Encounter (Signed)
Ok for referral?

## 2020-08-01 NOTE — Telephone Encounter (Signed)
I have entered the referral for this PT. Could you fax to the number the patient has provided? I will be glad to call and let her know the referral has been sent if you would like to send message back to me. Thanks.

## 2020-08-01 NOTE — Telephone Encounter (Signed)
Ok thanks 

## 2020-08-01 NOTE — Telephone Encounter (Signed)
Pt called asking to have a PT referral sent to [facility] and she would like a CB once this has been done.   [facility] Fax# [phone]  Pts # [phone]

## 2020-08-02 NOTE — Telephone Encounter (Signed)
I just faxed the referral this a.m. Thanks for calling her to let  her know.

## 2020-08-02 NOTE — Telephone Encounter (Signed)
I called and advised. 

## 2020-08-28 ENCOUNTER — Telehealth: Payer: Self-pay | Admitting: Orthopaedic Surgery

## 2020-08-28 NOTE — Telephone Encounter (Signed)
Ok for this? 

## 2020-08-28 NOTE — Telephone Encounter (Signed)
Patient's friend Verlon Au called asked if patient can be referred for (PT)   [facility] (PT) The fax# is [phone]   [phone] ph#    The number to contact Verlon Au is [phone]  Per Verlon Au patient does not speak [place]

## 2020-09-02 NOTE — Telephone Encounter (Signed)
Can we change the order

## 2020-09-02 NOTE — Telephone Encounter (Signed)
Ok thanks 

## 2020-09-03 NOTE — Telephone Encounter (Signed)
Referral has already been faxed to [facility] at [phone], pt is welcome to call [facility] and see what status is

## 2022-10-24 IMAGING — CR DG ANKLE COMPLETE 3+V*R*
3 series · 3 of 3 positions shown · non-contrast
Comparison: None.

CLINICAL DATA: Fall for

EXAM:
RIGHT ANKLE - COMPLETE 3+ VIEW

[x ankle ap right]
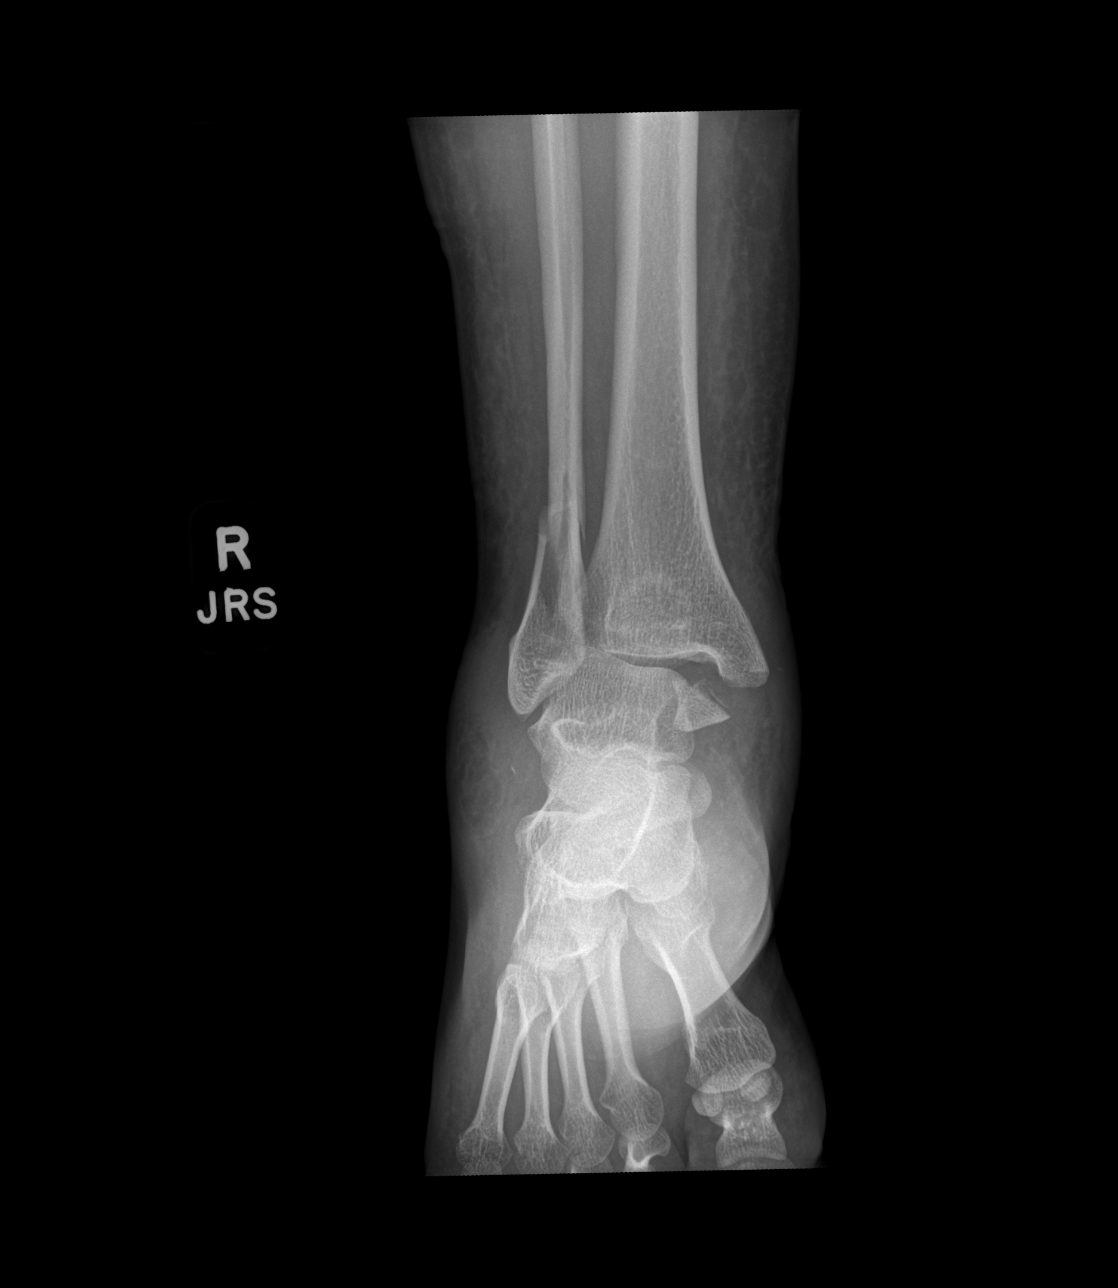

[x ankle obl right]
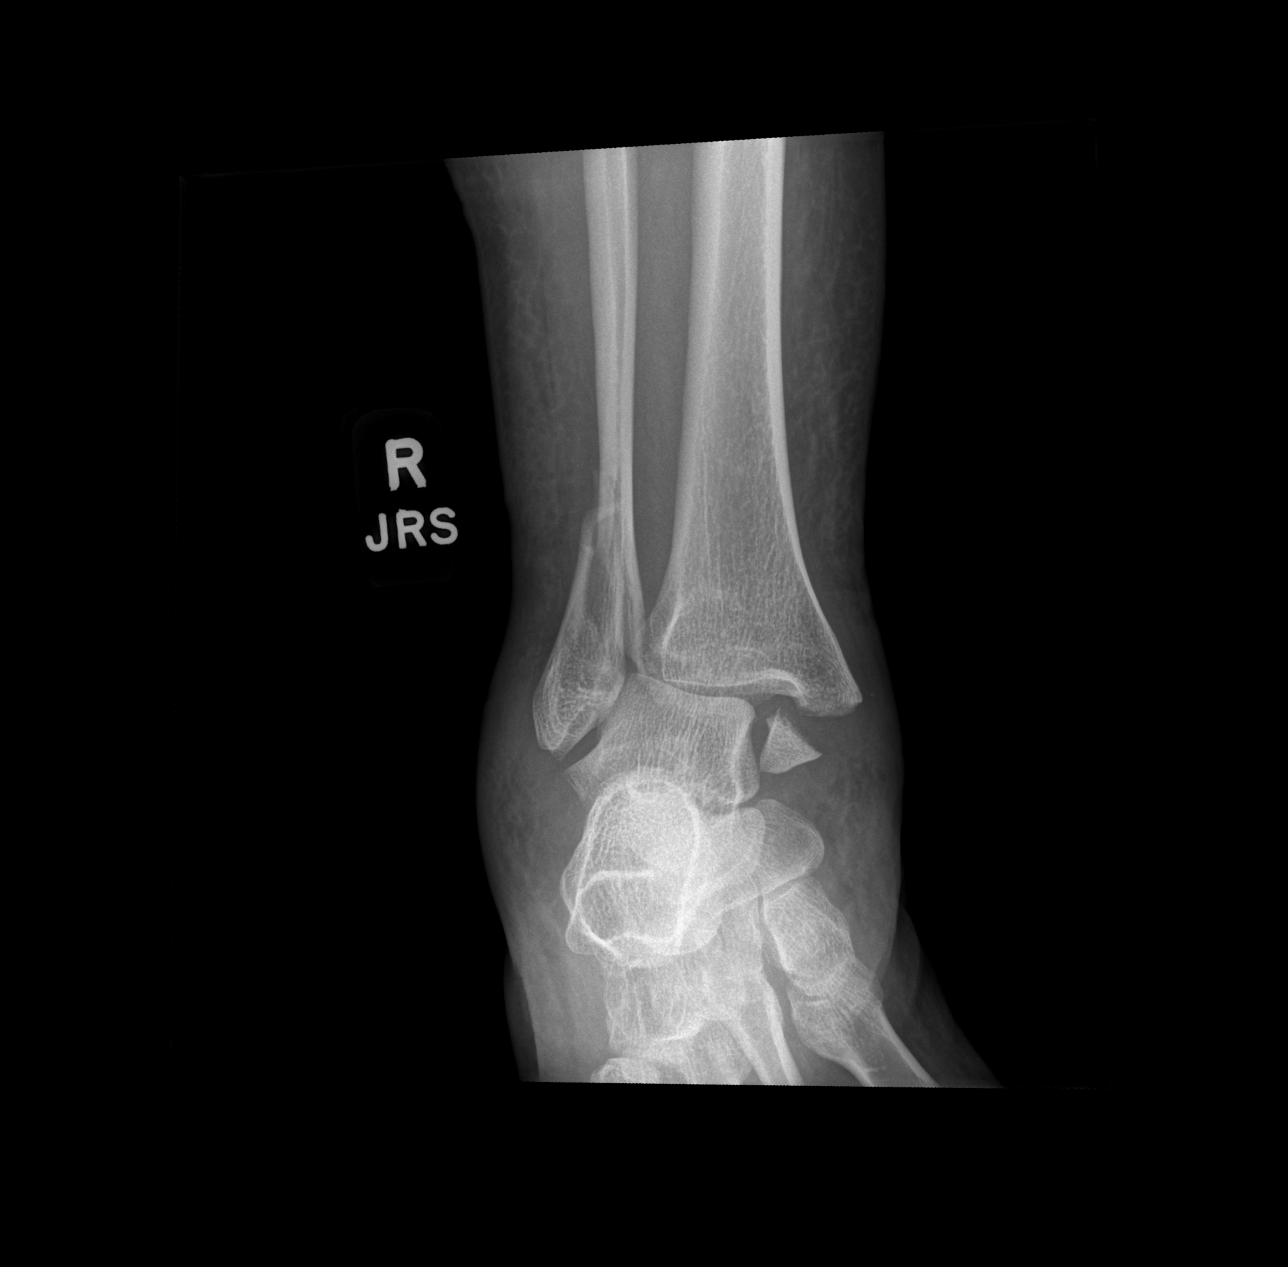

[x ankle lat right]
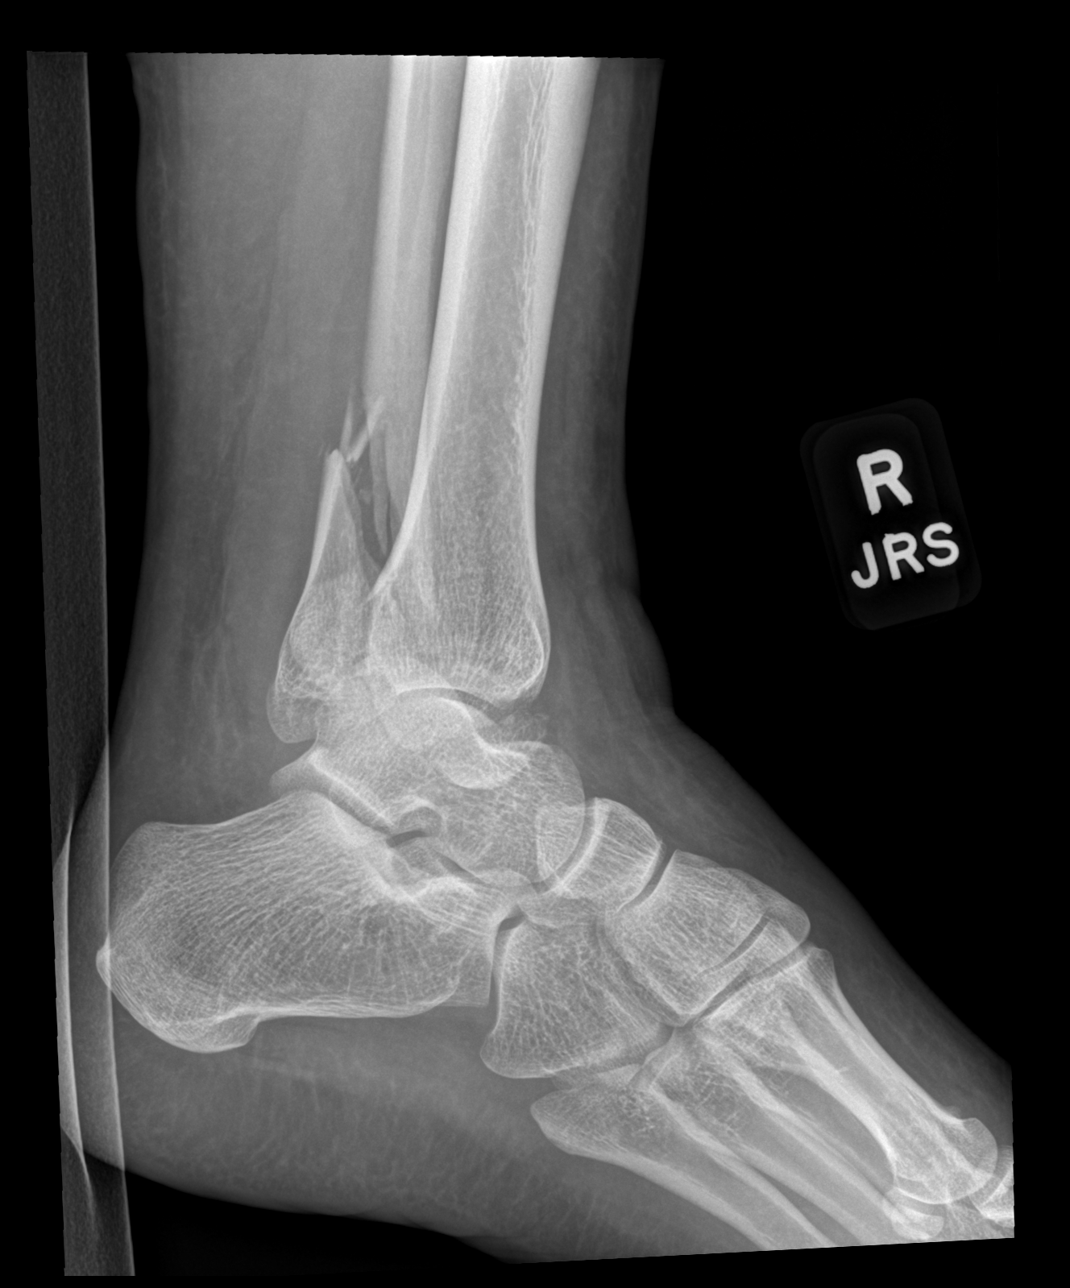

[3 of 3 positions shown; findings below may reference images not displayed]

FINDINGS: There is a comminuted displaced fracture seen through the medial
malleolus and distal fibula. There is widening of the medial clear
space measuring approximately 8 mm. There is significant surrounding
soft tissue swelling.
IMPRESSION: Comminuted displaced fractures through the medial malleolus and
distal fibula with 8 mm of medial clear space widening. There is
overlying soft tissue swelling.

## 2022-10-29 IMAGING — RF DG C-ARM 1-60 MIN
1 series · 5 of 5 positions shown · non-contrast
Comparison: March 01, 2020

CLINICAL DATA: ORIF

EXAM:
DG C-ARM 1-60 MIN
FLUOROSCOPY TIME:  Fluoroscopy Time:  9 seconds
Number of Acquired Spot Images: 5

[Series 1: unknown protocol · 0.14mm/px · 5 of 5 slices shown]
[im 1/5]
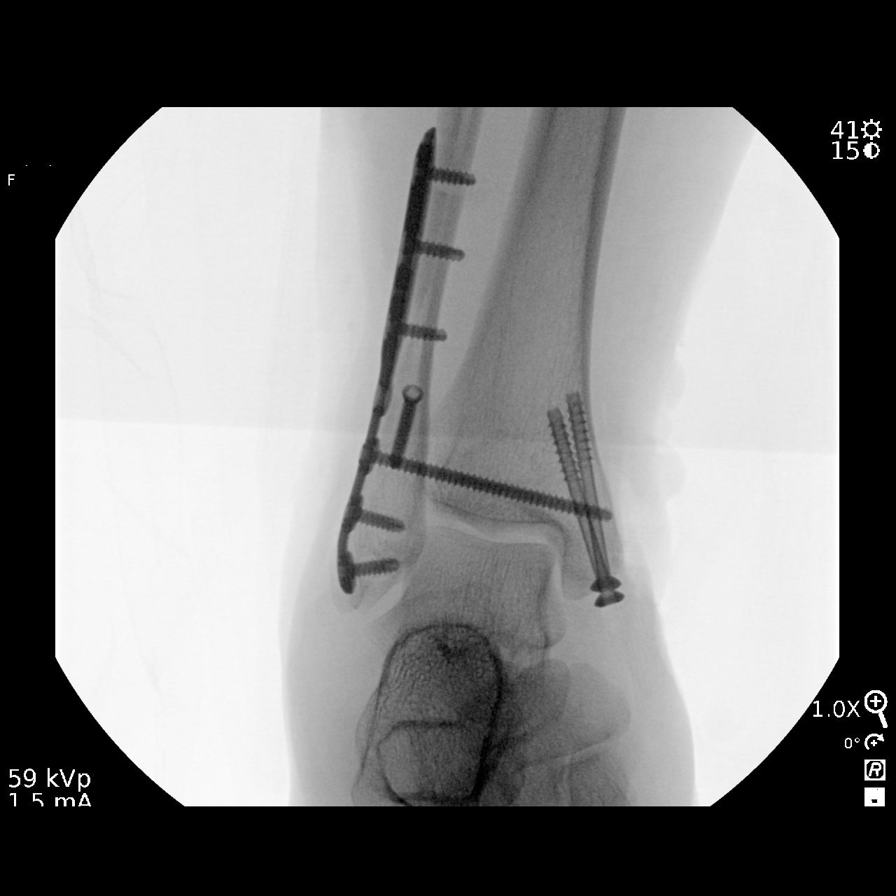
[im 2/5]
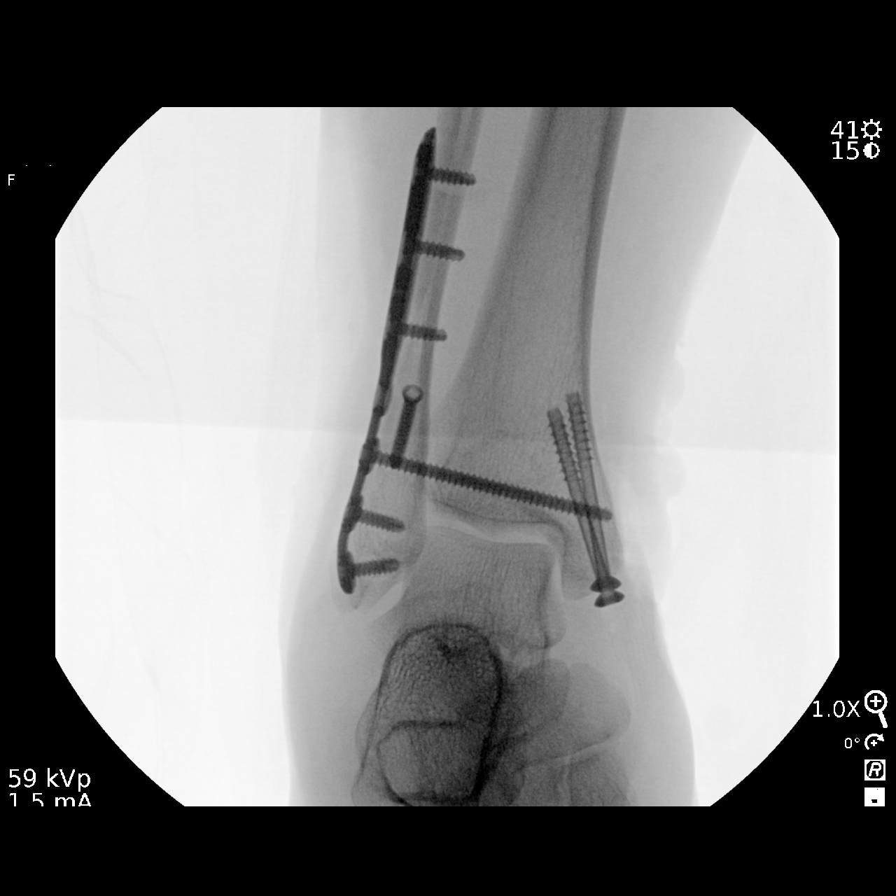
[im 3/5]
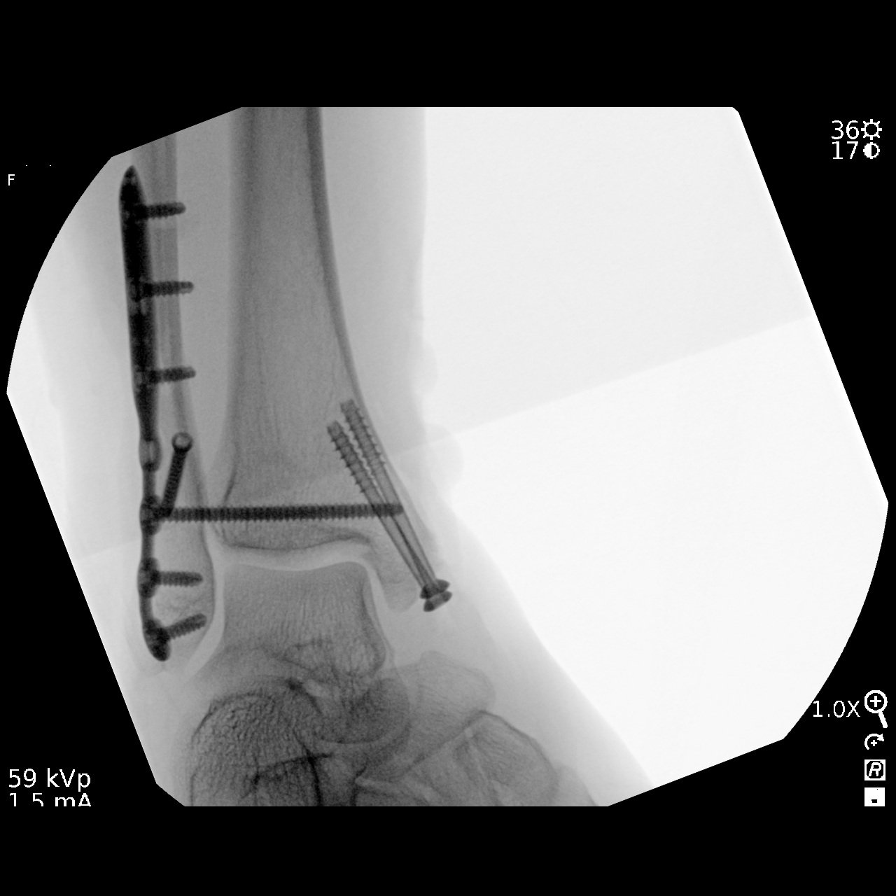
[im 4/5]
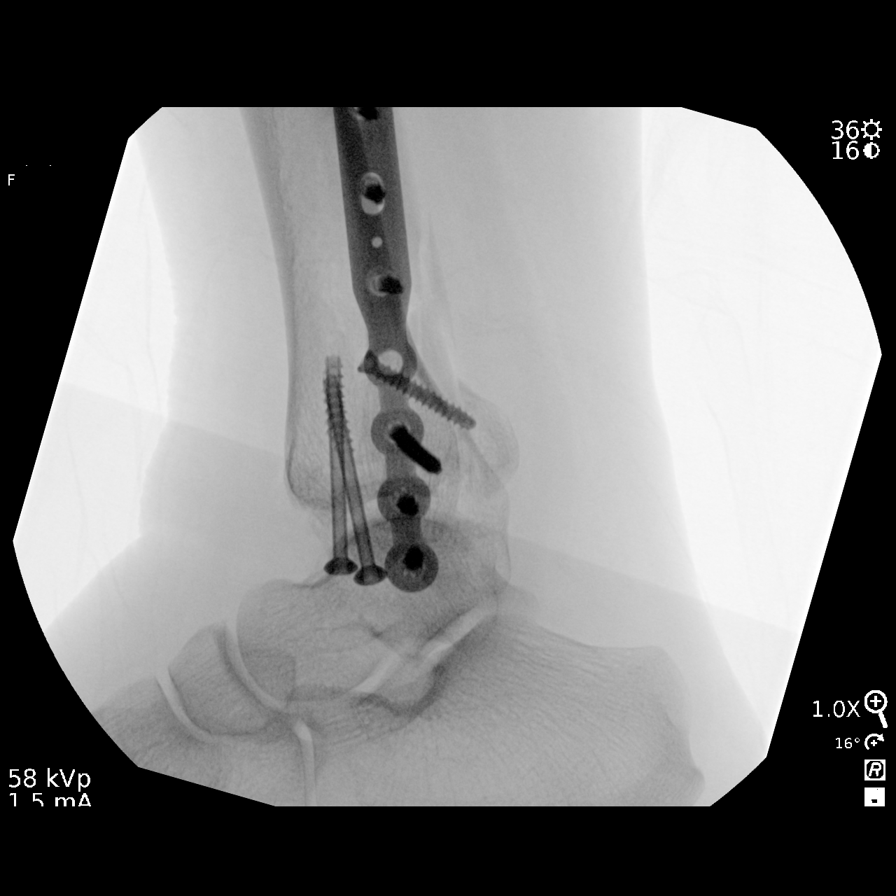
[im 5/5]
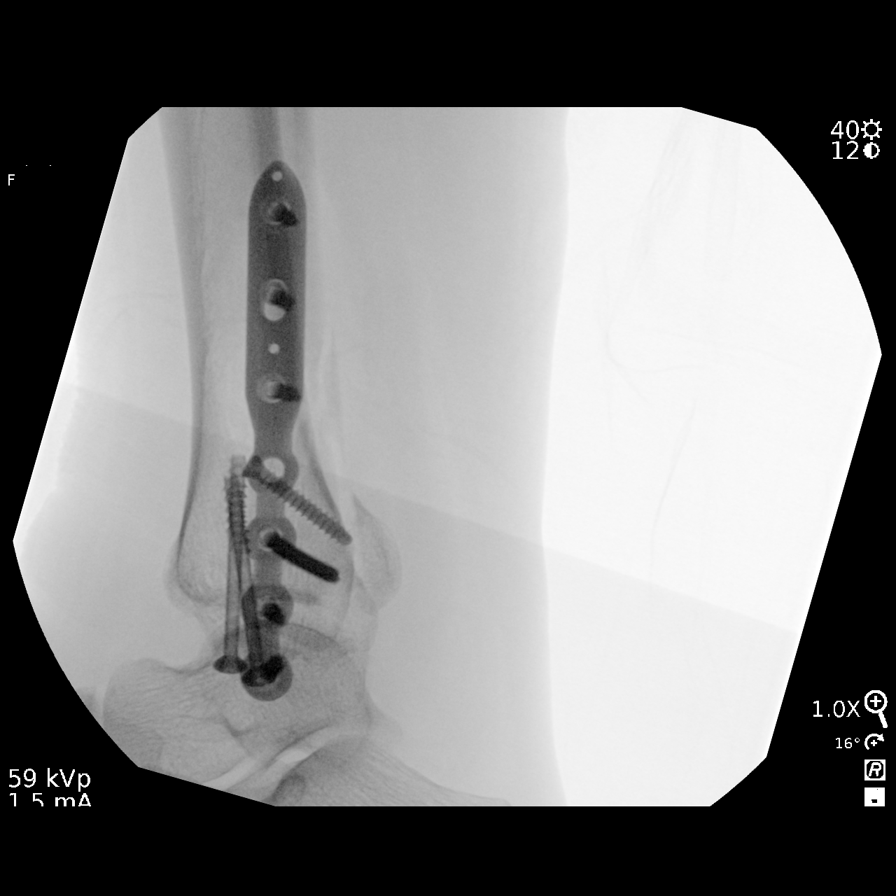

[5 of 5 positions shown; findings below may reference images not displayed]

FINDINGS: The patient has undergone ORIF of the ankle. There is new plate
screw fixation of the distal fibula. There are 2 transcortical
screws through the medial malleolus. There is a syndesmotic screw
noted. There are expected postsurgical changes. The osseous
alignment is significantly improved.
IMPRESSION: Status post ORIF of the ankle with significantly improved osseous
alignment.
